# Patient Record
Sex: Male | Born: 1970 | Race: White | Hispanic: No | Marital: Married | Smoking: Former smoker
Health system: Southern US, Community
[De-identification: ages and names within clinical notes are randomized; demographics above are authoritative.]

## PROBLEM LIST (undated history)

## (undated) DIAGNOSIS — Z973 Presence of spectacles and contact lenses: Secondary | ICD-10-CM

## (undated) DIAGNOSIS — N201 Calculus of ureter: Secondary | ICD-10-CM

## (undated) DIAGNOSIS — F1911 Other psychoactive substance abuse, in remission: Secondary | ICD-10-CM

## (undated) DIAGNOSIS — G43909 Migraine, unspecified, not intractable, without status migrainosus: Secondary | ICD-10-CM

## (undated) DIAGNOSIS — N200 Calculus of kidney: Secondary | ICD-10-CM

## (undated) DIAGNOSIS — Z87442 Personal history of urinary calculi: Secondary | ICD-10-CM

## (undated) HISTORY — PX: WISDOM TOOTH EXTRACTION: SHX21

---

## 1991-10-26 HISTORY — PX: OTHER SURGICAL HISTORY: SHX169

## 1993-10-25 HISTORY — PX: OTHER SURGICAL HISTORY: SHX169

## 1998-10-25 HISTORY — PX: OTHER SURGICAL HISTORY: SHX169

## 1999-07-15 ENCOUNTER — Ambulatory Visit (HOSPITAL_BASED_OUTPATIENT_CLINIC_OR_DEPARTMENT_OTHER): Admission: RE | Admit: 1999-07-15 | Discharge: 1999-07-15 | Payer: Self-pay | Admitting: Orthopedic Surgery

## 2009-10-25 HISTORY — PX: OTHER SURGICAL HISTORY: SHX169

## 2011-01-15 DIAGNOSIS — R079 Chest pain, unspecified: Secondary | ICD-10-CM

## 2011-04-11 ENCOUNTER — Observation Stay (HOSPITAL_COMMUNITY)
Admission: EM | Admit: 2011-04-11 | Discharge: 2011-04-12 | DRG: 464 | Disposition: A | Discharge status: Home / Self Care | Payer: BC Managed Care – PPO | Source: Ambulatory Visit | Attending: General Surgery | Admitting: General Surgery

## 2011-04-11 ENCOUNTER — Emergency Department (HOSPITAL_COMMUNITY): Payer: BC Managed Care – PPO

## 2011-04-11 ENCOUNTER — Encounter (HOSPITAL_COMMUNITY): Payer: Self-pay | Admitting: Radiology

## 2011-04-11 DIAGNOSIS — S139XXA Sprain of joints and ligaments of unspecified parts of neck, initial encounter: Principal | ICD-10-CM | POA: Insufficient documentation

## 2011-04-11 DIAGNOSIS — T07XXXA Unspecified multiple injuries, initial encounter: Secondary | ICD-10-CM | POA: Insufficient documentation

## 2011-04-11 DIAGNOSIS — R209 Unspecified disturbances of skin sensation: Secondary | ICD-10-CM | POA: Insufficient documentation

## 2011-04-11 DIAGNOSIS — R4789 Other speech disturbances: Secondary | ICD-10-CM | POA: Insufficient documentation

## 2011-04-11 DIAGNOSIS — IMO0002 Reserved for concepts with insufficient information to code with codable children: Secondary | ICD-10-CM | POA: Insufficient documentation

## 2011-04-11 DIAGNOSIS — Y998 Other external cause status: Secondary | ICD-10-CM | POA: Insufficient documentation

## 2011-04-11 DIAGNOSIS — R4182 Altered mental status, unspecified: Secondary | ICD-10-CM | POA: Insufficient documentation

## 2011-04-11 LAB — CBC
HCT: 44.5 % (ref 39.0–52.0)
MCV: 86.6 fL (ref 78.0–100.0)
RDW: 13.9 % (ref 11.5–15.5)
WBC: 9.9 10*3/uL (ref 4.0–10.5)

## 2011-04-11 LAB — COMPREHENSIVE METABOLIC PANEL
ALT: 40 U/L (ref 0–53)
AST: 28 U/L (ref 0–37)
Alkaline Phosphatase: 77 U/L (ref 39–117)
CO2: 23 mEq/L (ref 19–32)
Chloride: 103 mEq/L (ref 96–112)
Creatinine, Ser: 0.84 mg/dL (ref 0.50–1.35)
GFR calc non Af Amer: >60 mL/min (ref >60)
Sodium: 139 mEq/L (ref 135–145)
Total Bilirubin: 0.4 mg/dL (ref 0.3–1.2)

## 2011-04-11 LAB — POCT I-STAT, CHEM 8
HCT: 49 % (ref 39.0–52.0)
Hemoglobin: 16.7 g/dL (ref 13.0–17.0)
Potassium: 3.9 mEq/L (ref 3.5–5.1)
Sodium: 142 mEq/L (ref 135–145)

## 2011-04-11 LAB — TYPE AND SCREEN
ABO/RH(D): O POS
Antibody Screen: NEGATIVE
Unit division: 0

## 2011-04-11 LAB — PROTIME-INR: INR: 1.06 (ref 0.00–1.49)

## 2011-04-11 MED ORDER — IOHEXOL 300 MG/ML  SOLN
100.0000 mL | Freq: Once | INTRAMUSCULAR | Status: AC | PRN
  Administered 2011-04-11: 100 mL via INTRAVENOUS

## 2011-04-12 ENCOUNTER — Inpatient Hospital Stay (HOSPITAL_COMMUNITY): Payer: BC Managed Care – PPO

## 2011-04-12 LAB — CBC
HCT: 41 % (ref 39.0–52.0)
MCH: 30.5 pg (ref 26.0–34.0)
MCHC: 34.4 g/dL (ref 30.0–36.0)
MCV: 88.7 fL (ref 78.0–100.0)
RDW: 14.4 % (ref 11.5–15.5)

## 2011-04-12 LAB — DRUGS OF ABUSE SCREEN W/O ALC, ROUTINE URINE
Amphetamine Screen, Ur: NEGATIVE
Barbiturate Quant, Ur: NEGATIVE
Benzodiazepines.: NEGATIVE
Creatinine,U: 221 mg/dL
Marijuana Metabolite: NEGATIVE
Phencyclidine (PCP): NEGATIVE

## 2011-04-12 LAB — URINALYSIS, MICROSCOPIC ONLY
Bilirubin Urine: NEGATIVE
Hgb urine dipstick: NEGATIVE
Protein, ur: NEGATIVE mg/dL
Urobilinogen, UA: 0.2 mg/dL (ref 0.0–1.0)

## 2011-04-12 LAB — BASIC METABOLIC PANEL
CO2: 26 mEq/L (ref 19–32)
GFR calc non Af Amer: >60 mL/min (ref >60)
Glucose, Bld: 120 mg/dL — ABNORMAL HIGH (ref 70–99)
Potassium: 4.1 mEq/L (ref 3.5–5.1)
Sodium: 140 mEq/L (ref 135–145)
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
## 2013-07-27 ENCOUNTER — Encounter: Payer: Self-pay | Admitting: Internal Medicine

## 2013-07-27 ENCOUNTER — Ambulatory Visit (INDEPENDENT_AMBULATORY_CARE_PROVIDER_SITE_OTHER): Payer: BC Managed Care – PPO | Admitting: Internal Medicine

## 2013-07-27 ENCOUNTER — Ambulatory Visit (INDEPENDENT_AMBULATORY_CARE_PROVIDER_SITE_OTHER): Payer: BC Managed Care – PPO

## 2013-07-27 VITALS — BP 114/80 | HR 72 | Temp 97.8°F | Ht 70.0 in | Wt 193.8 lb

## 2013-07-27 DIAGNOSIS — Z130 Encounter for screening for diseases of the blood and blood-forming organs and certain disorders involving the immune mechanism: Secondary | ICD-10-CM

## 2013-07-27 DIAGNOSIS — Z13220 Encounter for screening for lipoid disorders: Secondary | ICD-10-CM

## 2013-07-27 DIAGNOSIS — F329 Major depressive disorder, single episode, unspecified: Secondary | ICD-10-CM

## 2013-07-27 DIAGNOSIS — R519 Headache, unspecified: Secondary | ICD-10-CM

## 2013-07-27 DIAGNOSIS — Z0000 Encounter for general adult medical examination without abnormal findings: Secondary | ICD-10-CM

## 2013-07-27 DIAGNOSIS — F32A Depression, unspecified: Secondary | ICD-10-CM

## 2013-07-27 DIAGNOSIS — F429 Obsessive-compulsive disorder, unspecified: Secondary | ICD-10-CM

## 2013-07-27 DIAGNOSIS — R51 Headache: Secondary | ICD-10-CM

## 2013-07-27 DIAGNOSIS — F341 Dysthymic disorder: Secondary | ICD-10-CM

## 2013-07-27 LAB — CBC
HCT: 44.4 % (ref 39.0–52.0)
Hemoglobin: 15.3 g/dL (ref 13.0–17.0)
RBC: 4.97 Mil/uL (ref 4.22–5.81)
WBC: 8.3 10*3/uL (ref 4.5–10.5)

## 2013-07-27 LAB — COMPREHENSIVE METABOLIC PANEL
BUN: 10 mg/dL (ref 6–23)
CO2: 26 mEq/L (ref 19–32)
Creatinine, Ser: 0.9 mg/dL (ref 0.4–1.5)
GFR: 93.93 mL/min (ref >60.00)
Glucose, Bld: 82 mg/dL (ref 70–99)
Total Bilirubin: 0.8 mg/dL (ref 0.3–1.2)

## 2013-07-27 LAB — LIPID PANEL
Cholesterol: 216 mg/dL — ABNORMAL HIGH (ref 0–200)
HDL: 45.6 mg/dL (ref >39.00)
VLDL: 40.4 mg/dL — ABNORMAL HIGH (ref 0.0–40.0)

## 2013-07-27 MED ORDER — CITALOPRAM HYDROBROMIDE 20 MG PO TABS: 20.0000 mg | ORAL_TABLET | Freq: Every day | ORAL | Status: DC

## 2013-07-27 MED ORDER — SUMATRIPTAN SUCCINATE 100 MG PO TABS: 100.0000 mg | ORAL_TABLET | ORAL | Status: DC | PRN

## 2013-07-27 NOTE — Patient Instructions (Signed)
Health Maintenance, Males A healthy lifestyle and preventative care can promote health and wellness.  Maintain regular health, dental, and eye exams.  Eat a healthy diet. Foods like vegetables, fruits, whole grains, low-fat dairy products, and lean protein foods contain the nutrients you need without too many calories. Decrease your intake of foods high in solid fats, added sugars, and salt. Get information about a proper diet from your caregiver, if necessary.  Regular physical exercise is one of the most important things you can do for your health. Most adults should get at least 150 minutes of moderate-intensity exercise (any activity that increases your heart rate and causes you to sweat) each week. In addition, most adults need muscle-strengthening exercises on 2 or more days a week.   Maintain a healthy weight. The body mass index (BMI) is a screening tool to identify possible weight problems. It provides an estimate of body fat based on height and weight. Your caregiver can help determine your BMI, and can help you achieve or maintain a healthy weight. For adults 20 years and older:  A BMI below 18.5 is considered underweight.  A BMI of 18.5 to 24.9 is normal.  A BMI of 25 to 29.9 is considered overweight.  A BMI of 30 and above is considered obese.  Maintain normal blood lipids and cholesterol by exercising and minimizing your intake of saturated fat. Eat a balanced diet with plenty of fruits and vegetables. Blood tests for lipids and cholesterol should begin at age 20 and be repeated every 5 years. If your lipid or cholesterol levels are high, you are over 50, or you are a high risk for heart disease, you may need your cholesterol levels checked more frequently.Ongoing high lipid and cholesterol levels should be treated with medicines, if diet and exercise are not effective.  If you smoke, find out from your caregiver how to quit. If you do not use tobacco, do not start.  If you  choose to drink alcohol, do not exceed 2 drinks per day. One drink is considered to be 12 ounces (355 mL) of beer, 5 ounces (148 mL) of wine, or 1.5 ounces (44 mL) of liquor.  Avoid use of street drugs. Do not share needles with anyone. Ask for help if you need support or instructions about stopping the use of drugs.  High blood pressure causes heart disease and increases the risk of stroke. Blood pressure should be checked at least every 1 to 2 years. Ongoing high blood pressure should be treated with medicines if weight loss and exercise are not effective.  If you are 45 to 43 years old, ask your caregiver if you should take aspirin to prevent heart disease.  Diabetes screening involves taking a blood sample to check your fasting blood sugar level. This should be done once every 3 years, after age 45, if you are within normal weight and without risk factors for diabetes. Testing should be considered at a younger age or be carried out more frequently if you are overweight and have at least 1 risk factor for diabetes.  Colorectal cancer can be detected and often prevented. Most routine colorectal cancer screening begins at the age of 50 and continues through age 75. However, your caregiver may recommend screening at an earlier age if you have risk factors for colon cancer. On a yearly basis, your caregiver may provide home test kits to check for hidden blood in the stool. Use of a small camera at the end of a tube,   to directly examine the colon (sigmoidoscopy or colonoscopy), can detect the earliest forms of colorectal cancer. Talk to your caregiver about this at age 50, when routine screening begins. Direct examination of the colon should be repeated every 5 to 10 years through age 75, unless early forms of pre-cancerous polyps or small growths are found.  Hepatitis C blood testing is recommended for all people born from 1945 through 1965 and any individual with known risks for hepatitis C.  Healthy  men should no longer receive prostate-specific antigen (PSA) blood tests as part of routine cancer screening. Consult with your caregiver about prostate cancer screening.  Testicular cancer screening is not recommended for adolescents or adult males who have no symptoms. Screening includes self-exam, caregiver exam, and other screening tests. Consult with your caregiver about any symptoms you have or any concerns you have about testicular cancer.  Practice safe sex. Use condoms and avoid high-risk sexual practices to reduce the spread of sexually transmitted infections (STIs).  Use sunscreen with a sun protection factor (SPF) of 30 or greater. Apply sunscreen liberally and repeatedly throughout the day. You should seek shade when your shadow is shorter than you. Protect yourself by wearing long sleeves, pants, a wide-brimmed hat, and sunglasses year round, whenever you are outdoors.  Notify your caregiver of new moles or changes in moles, especially if there is a change in shape or color. Also notify your caregiver if a mole is larger than the size of a pencil eraser.  A one-time screening for abdominal aortic aneurysm (AAA) and surgical repair of large AAAs by sound wave imaging (ultrasonography) is recommended for ages 65 to 75 years who are current or former smokers.  Stay current with your immunizations. Document Released: 04/08/2008 Document Revised: 01/03/2012 Document Reviewed: 03/08/2011 Exit[employer] Patient Information 2014 [employer].  

## 2013-07-27 NOTE — Progress Notes (Signed)
HPI  Pt presents to the clinic today to establish care. He does have multiple concerns today. 1- He has headaches mostly every day. The headaches start behind his eyes. It does feel like the headaches "flushes" up his forehead. He describes them as sharp and stabbing. He does take imitrex. This does take the edge off. He is having more frequent headaches. He is very stressed and does report having OCD. He has never been treated for OCD. 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 2- He c/o losing his things around the house. This makes him think he has a blood clot in his brain. This is all he can think about. His grandma had a blood clot in her brain. He denies one sided weakness, confusion, difficulty with speech or thought process.  Flu: 2014 Tetanus: within last 10 years Eye Doctor: yearly Dentist: biannually     History reviewed. No pertinent past medical history.  Current Outpatient Prescriptions  Medication Sig Dispense Refill  . SUMAtriptan (IMITREX) 100 MG tablet Take 100 mg by mouth every 2 (two) hours as needed for migraine. May repeat in 2 hours if headache persists or recurs.       No current facility-administered medications for this visit.    No Known Allergies  History reviewed. No pertinent family history.  History   Social History  . Marital Status: Married    Spouse Name: N/A    Number of Children: N/A  . Years of Education: N/A   Occupational History  . Not on file.   Social History Main Topics  . Smoking status: Current Every Day Smoker    Types: Cigarettes  . Smokeless tobacco: Not on file  . Alcohol Use: No  . Drug Use: No  . Sexual Activity: Not on file   Other Topics Concern  . Not on file   Social History Narrative  . No narrative on file    ROS:  Constitutional: Pt reports headaches. Denies fever, malaise, fatigue, or abrupt weight changes.  HEENT:  Denies eye pain, eye redness, ear pain, ringing in the ears, wax buildup, runny nose, nasal congestion, bloody nose, or sore throat. Respiratory: Denies difficulty breathing, shortness of breath, cough or sputum production.   Cardiovascular: Denies chest pain, chest tightness, palpitations or swelling in the hands or feet.  Gastrointestinal: Denies abdominal pain, bloating, constipation, diarrhea or blood in the stool.  GU: Denies frequency, urgency, pain with urination, blood in urine, odor or discharge. Musculoskeletal: Denies decrease in range of motion, difficulty with gait, muscle pain or joint pain and swelling.  Skin: Denies redness, rashes, lesions or ulcercations.  Neurological: Denies dizziness, difficulty with memory, difficulty with speech or problems with balance and coordination.   No other specific complaints in a complete review of systems (except as listed in HPI above).  PE:  BP 114/80  Pulse 72  Temp(Src) 97.8 F (36.6 C) (Oral)  Ht 5\' 10"  (1.778 m)  Wt 193 lb 12 oz (87.884 kg)  BMI 27.8 kg/m2  SpO2 98% Wt Readings from Last 3 Encounters:  07/27/13 193 lb 12 oz (87.884 kg)    General: Appears his stated age, well developed, well nourished in NAD. HEENT: Head: normal shape and size; Eyes: sclera white, no icterus, conjunctiva pink, PERRLA and EOMs intact; Ears: Tm's gray and intact, normal light reflex; Nose: mucosa pink and moist, septum midline; Throat/Mouth: Teeth present, mucosa pink  and moist, no lesions or ulcerations noted.  Neck: Normal range of motion. Neck supple, trachea midline. No massses, lumps or thyromegaly present.  Cardiovascular: Normal rate and rhythm. S1,S2 noted.  No murmur, rubs or gallops noted. No JVD or BLE edema. No carotid bruits noted. Pulmonary/Chest: Normal effort and positive vesicular breath sounds. No respiratory distress. No wheezes, rales or ronchi noted.  Abdomen: Soft and nontender. Normal bowel sounds, no bruits noted. No  distention or masses noted. Liver, spleen and kidneys non palpable. Musculoskeletal: Normal range of motion. No signs of joint swelling. No difficulty with gait. Notable left AC joint abnormality. Neurological: Alert and oriented. Cranial nerves II-XII intact. Coordination normal. +DTRs bilaterally. Psychiatric: Mood somewhat obsessive and affect normal. Behavior is normal. Judgment and thought content normal.    BMET    Component Value Date/Time   NA 140 04/12/2011 0600   K 4.1 04/12/2011 0600   CL 107 04/12/2011 0600   CO2 26 04/12/2011 0600   GLUCOSE 120* 04/12/2011 0600   BUN 5* 04/12/2011 0600   CREATININE 0.63 04/12/2011 0600   CALCIUM 8.5 04/12/2011 0600   GFRNONAA >60 04/12/2011 0600   GFRAA >60 04/12/2011 0600    Lipid Panel  No results found for this basename: chol, trig, hdl, cholhdl, vldl, ldlcalc    CBC    Component Value Date/Time   WBC 8.8 04/12/2011 0600   RBC 4.62 04/12/2011 0600   HGB 14.1 04/12/2011 0600   HCT 41.0 04/12/2011 0600   PLT 218 04/12/2011 0600   MCV 88.7 04/12/2011 0600   MCH 30.5 04/12/2011 0600   MCHC 34.4 04/12/2011 0600   RDW 14.4 04/12/2011 0600    Hgb A1C No results found for this basename: HGBA1C     Assessment and Plan:  Prevent Health Maintenance:  All HM UTD Pt declines flu shot today Will obtain screening labs today  RTC in 6 months or sooner if needed

## 2013-07-27 NOTE — Assessment & Plan Note (Signed)
Offered pt referral to psychology for evaluation and diagnosis-pt decline Will treat with celexa for now

## 2013-07-27 NOTE — Assessment & Plan Note (Signed)
I think this is the cause of his headaches Lets start celexa 20 mg daily No benzodiazepines d/t hx of drug abuse I think he would benefit from CBT- pt declines at this time

## 2013-07-27 NOTE — Assessment & Plan Note (Signed)
?   Stress releated Lets treat stress and anxiety with celexa If no better in 4 weeks- trial verapamil  Has had MRI of brain which was normal

## 2013-08-02 ENCOUNTER — Telehealth: Payer: Self-pay | Admitting: Internal Medicine

## 2013-08-02 NOTE — Telephone Encounter (Signed)
Recd records from [facility], Forwarding 37pgs to [employer]

## 2013-08-21 ENCOUNTER — Encounter: Payer: Self-pay | Admitting: Internal Medicine

## 2013-09-04 ENCOUNTER — Telehealth: Payer: Self-pay | Admitting: Internal Medicine

## 2013-09-04 NOTE — Telephone Encounter (Signed)
Patient's wife said you asked her to call you in a month about patient.  Please call Christopher Colon.

## 2013-09-04 NOTE — Telephone Encounter (Signed)
I wanted to know if his headaches had gotten better since we started him on the celexa. If they have not, I was going to put him on verapamil.

## 2013-09-04 NOTE — Telephone Encounter (Signed)
I spoke with patient's wife and she said his headaches were better until last Thursday.  He had a bad headache last Thursday and had to come home from work on Friday.  He stayed in bed on Friday and Saturday.  She said by Monday the headache was gone.  She said he has been sweating since he has been taking the medication.  It has affected him sexually.  She said he has trouble ejaculating.  I asked her if he wanted to change medications and she said she would let you decide if he needs to change his medication.  His pharmacy is [facility] and she said you can call her on her cell phone.

## 2013-10-01 ENCOUNTER — Other Ambulatory Visit: Payer: Self-pay | Admitting: *Deleted

## 2013-10-01 DIAGNOSIS — F329 Major depressive disorder, single episode, unspecified: Secondary | ICD-10-CM

## 2013-10-01 MED ORDER — CITALOPRAM HYDROBROMIDE 20 MG PO TABS: 20.0000 mg | ORAL_TABLET | Freq: Every day | ORAL | Status: DC

## 2013-10-08 ENCOUNTER — Other Ambulatory Visit: Payer: Self-pay | Admitting: Internal Medicine

## 2013-11-05 ENCOUNTER — Other Ambulatory Visit: Payer: Self-pay

## 2013-11-05 ENCOUNTER — Other Ambulatory Visit: Payer: Self-pay | Admitting: Internal Medicine

## 2013-11-05 NOTE — Telephone Encounter (Signed)
Left message on voicemail for pt to return call  

## 2013-11-05 NOTE — Telephone Encounter (Signed)
Ok then I am refusing the refill request because if this is a 3 month supply, he should be getting it filled every 3 months, not monthly, Last refill was 10/08/13

## 2013-11-05 NOTE — Telephone Encounter (Signed)
Can you call pt and verify how often he is taking his flexeril?

## 2013-11-05 NOTE — Telephone Encounter (Signed)
Pt does take it BID but sometimes he does not need to and only does QD--but his insurance only covers 90 day supply

## 2013-11-05 NOTE — Telephone Encounter (Signed)
Last filled 10/08/13--please advise 

## 2013-11-07 ENCOUNTER — Telehealth: Payer: Self-pay

## 2013-11-07 NOTE — Telephone Encounter (Signed)
She needs to be seen.

## 2013-11-07 NOTE — Telephone Encounter (Signed)
Mrs Yanes said pt came home from work and went straight to bed with aching all over, non prod cough, has not taken temp but pt goes between feeling hot and cold. Pt began to feel symptoms on 11/06/12 but is much worse today and 3 coworkers have tested positive for flu. Pt not having SOB, wheezing or CP. Mrs Treese said pt does not feel like getting out of bed to be seen; request advise and med sent to [facility]. Mrs Sussman request cb.

## 2013-11-08 NOTE — Telephone Encounter (Signed)
Was unable to leave VM on cell phone because it has not been set up but let one on home number asking pt to return call

## 2013-11-08 NOTE — Telephone Encounter (Signed)
Spoke to patient's wife and she is aware that he needs to be seen but she states he will not come in and if he gets worse maybe he will decide to come in

## 2013-11-17 ENCOUNTER — Other Ambulatory Visit: Payer: Self-pay | Admitting: Internal Medicine

## 2013-11-19 NOTE — Telephone Encounter (Signed)
Christopher Colon pts wife left v/m; Christopher Colon thought cyclobenzaprine 5 mg was called in on 10/08/13 and she did not pick up rx because pt takes cyclobenzaprine 10 mg. Spoke with Christopher Colon, pharmacist at [facility] and he checked and rx was sent electronically on 10/08/13 for 10 mg not 5 mg. Christopher Colon will get ready and Christopher Colon working at that pharmacy and he will [employer].

## 2013-12-31 ENCOUNTER — Other Ambulatory Visit: Payer: Self-pay | Admitting: Internal Medicine

## 2014-02-15 ENCOUNTER — Other Ambulatory Visit: Payer: Self-pay | Admitting: Internal Medicine

## 2014-02-18 NOTE — Telephone Encounter (Signed)
Last OV was 10/14--Rx last refilled 12/14 90 day supply--please advise

## 2014-03-28 ENCOUNTER — Telehealth: Payer: Self-pay | Admitting: Internal Medicine

## 2014-03-28 NOTE — Telephone Encounter (Signed)
Patient Information:  Caller Name: Butch Penny  Phone: [phone]  Patient: Kenan, Moodie  Gender: Male  DOB: Jun 24, 1971  Age: 43 Years  PCP: Webb Silversmith  Office Follow Up:  Does the office need to follow up with this patient?: No  Instructions For The Office: N/A  RN Note:  Pt agrees to OV vs ED today  Symptoms  Reason For Call & Symptoms: Pt has ingrown toe nail.  It has been an ongoing problem.  Wife states that it looks really bad.  It red and inflammed.  Drainage is also present.  The toe is red.  Small part of the toe nail is black.  Pain is severe  Reviewed Health History In EMR: Yes  Reviewed Medications In EMR: Yes  Reviewed Allergies In EMR: Yes  Reviewed Surgeries / Procedures: Yes  Date of Onset of Symptoms: 03/21/2014  Guideline(s) Used:  Toe Pain  Disposition Per Guideline:   Go to Office Now  Reason For Disposition Reached:   Severe pain (e.g., excruciating, unable to do any normal activities) and not improved after 2 hours of pain medicine  Advice Given:  N/A  Patient Will Follow Care Advice:  YES

## 2014-04-18 ENCOUNTER — Emergency Department (HOSPITAL_COMMUNITY): Payer: PRIVATE HEALTH INSURANCE

## 2014-04-18 ENCOUNTER — Encounter (HOSPITAL_COMMUNITY): Payer: Self-pay | Admitting: Emergency Medicine

## 2014-04-18 ENCOUNTER — Emergency Department (HOSPITAL_COMMUNITY)
Admission: EM | Admit: 2014-04-18 | Discharge: 2014-04-18 | Disposition: A | Discharge status: Home / Self Care | Payer: PRIVATE HEALTH INSURANCE | Attending: Emergency Medicine | Admitting: Emergency Medicine

## 2014-04-18 DIAGNOSIS — N200 Calculus of kidney: Secondary | ICD-10-CM

## 2014-04-18 DIAGNOSIS — Z8619 Personal history of other infectious and parasitic diseases: Secondary | ICD-10-CM | POA: Insufficient documentation

## 2014-04-18 DIAGNOSIS — G43911 Migraine, unspecified, intractable, with status migrainosus: Secondary | ICD-10-CM | POA: Insufficient documentation

## 2014-04-18 DIAGNOSIS — Z79899 Other long term (current) drug therapy: Secondary | ICD-10-CM | POA: Insufficient documentation

## 2014-04-18 DIAGNOSIS — Z87891 Personal history of nicotine dependence: Secondary | ICD-10-CM | POA: Insufficient documentation

## 2014-04-18 LAB — CBC WITH DIFFERENTIAL/PLATELET
BASOS PCT: 1 % (ref 0–1)
Basophils Absolute: 0 10*3/uL (ref 0.0–0.1)
Eosinophils Absolute: 0.1 10*3/uL (ref 0.0–0.7)
Eosinophils Relative: 1 % (ref 0–5)
HCT: 39.7 % (ref 39.0–52.0)
Hemoglobin: 13.6 g/dL (ref 13.0–17.0)
LYMPHS ABS: 2.6 10*3/uL (ref 0.7–4.0)
Lymphocytes Relative: 48 % — ABNORMAL HIGH (ref 12–46)
MCH: 30 pg (ref 26.0–34.0)
MCHC: 34.3 g/dL (ref 30.0–36.0)
MCV: 87.6 fL (ref 78.0–100.0)
MONO ABS: 0.6 10*3/uL (ref 0.1–1.0)
MONOS PCT: 11 % (ref 3–12)
NEUTROS ABS: 2.1 10*3/uL (ref 1.7–7.7)
Neutrophils Relative %: 39 % — ABNORMAL LOW (ref 43–77)
Platelets: 188 10*3/uL (ref 150–400)
RBC: 4.53 MIL/uL (ref 4.22–5.81)
RDW: 13.4 % (ref 11.5–15.5)
WBC: 5.4 10*3/uL (ref 4.0–10.5)

## 2014-04-18 LAB — COMPREHENSIVE METABOLIC PANEL
ALBUMIN: 4.1 g/dL (ref 3.5–5.2)
ALK PHOS: 55 U/L (ref 39–117)
ALT: 70 U/L — ABNORMAL HIGH (ref 0–53)
AST: 40 U/L — ABNORMAL HIGH (ref 0–37)
BILIRUBIN TOTAL: 0.4 mg/dL (ref 0.3–1.2)
BUN: 10 mg/dL (ref 6–23)
CHLORIDE: 102 meq/L (ref 96–112)
CO2: 26 mEq/L (ref 19–32)
CREATININE: 0.93 mg/dL (ref 0.50–1.35)
Calcium: 9.5 mg/dL (ref 8.4–10.5)
GLUCOSE: 77 mg/dL (ref 70–99)
Potassium: 4 mEq/L (ref 3.7–5.3)
Sodium: 141 mEq/L (ref 137–147)
Total Protein: 7.1 g/dL (ref 6.0–8.3)

## 2014-04-18 LAB — URINALYSIS, ROUTINE W REFLEX MICROSCOPIC
Bilirubin Urine: NEGATIVE
Glucose, UA: NEGATIVE mg/dL
Ketones, ur: NEGATIVE mg/dL
Leukocytes, UA: NEGATIVE
NITRITE: NEGATIVE
PROTEIN: NEGATIVE mg/dL
SPECIFIC GRAVITY, URINE: 1.009 (ref 1.005–1.030)
UROBILINOGEN UA: 0.2 mg/dL (ref 0.0–1.0)
pH: 6.5 (ref 5.0–8.0)

## 2014-04-18 LAB — URINE MICROSCOPIC-ADD ON

## 2014-04-18 MED ORDER — TAMSULOSIN HCL 0.4 MG PO CAPS: 0.4000 mg | ORAL_CAPSULE | Freq: Every day | ORAL | Status: DC

## 2014-04-18 MED ORDER — ONDANSETRON HCL 4 MG/2ML IJ SOLN
4.0000 mg | Freq: Once | INTRAMUSCULAR | Status: AC
  Administered 2014-04-18: 4 mg via INTRAVENOUS
  Filled 2014-04-18: qty 2

## 2014-04-18 MED ORDER — HYDROMORPHONE HCL PF 1 MG/ML IJ SOLN
1.0000 mg | Freq: Once | INTRAMUSCULAR | Status: AC
  Administered 2014-04-18: 1 mg via INTRAVENOUS
  Filled 2014-04-18: qty 1

## 2014-04-18 MED ORDER — OXYCODONE-ACETAMINOPHEN 5-325 MG PO TABS: 1.0000 | ORAL_TABLET | ORAL | Status: DC | PRN

## 2014-04-18 NOTE — Discharge Instructions (Signed)
Kidney Stones °Kidney stones (urolithiasis) are solid masses that form inside your kidneys. The intense pain is caused by the stone moving through the kidney, ureter, bladder, and urethra (urinary tract). When the stone moves, the ureter starts to spasm around the stone. The stone is usually passed in your pee (urine).  °HOME CARE °· Drink enough fluids to keep your pee clear or pale yellow. This helps to get the stone out. °· Strain all pee through the provided strainer. Do not pee without peeing through the strainer, not even once. If you pee the stone out, catch it in the strainer. The stone may be as small as a grain of salt. Take this to your doctor. This will help your doctor figure out what you can do to try to prevent more kidney stones. °· Only take medicine as told by your doctor. °· Follow up with your doctor as told. °· Get follow-up X-rays as told by your doctor. °GET HELP IF: °You have pain that gets worse even if you have been taking pain medicine. °GET HELP RIGHT AWAY IF:  °· Your pain does not get better with medicine. °· You have a fever or shaking chills. °· Your pain increases and gets worse over 18 hours. °· You have new belly (abdominal) pain. °· You feel faint or pass out. °· You are unable to pee. °MAKE SURE YOU:  °· Understand these instructions. °· Will watch your condition. °· Will get help right away if you are not doing well or get worse. °Document Released: 03/29/2008 Document Revised: 06/13/2013 Document Reviewed: 03/14/2013 °ExitCare® Patient Information ©2015 [employer]. This information is not intended to replace advice given to you by your health care provider. Make sure you discuss any questions you have with your health care provider. ° °Dietary Guidelines to Help Prevent Kidney Stones °Your risk of kidney stones can be decreased by adjusting the foods you eat. The most important thing you can do is drink enough fluid. You should drink enough fluid to keep your urine clear or  pale yellow. The following guidelines provide specific information for the type of kidney stone you have had. °GUIDELINES ACCORDING TO TYPE OF KIDNEY STONE °Calcium Oxalate Kidney Stones °· Reduce the amount of salt you eat. Foods that have a lot of salt cause your body to release excess calcium into your urine. The excess calcium can combine with a substance called oxalate to form kidney stones. °· Reduce the amount of animal protein you eat if the amount you eat is excessive. Animal protein causes your body to release excess calcium into your urine. Ask your dietitian how much protein from animal sources you should be eating. °· Avoid foods that are high in oxalates. If you take vitamins, they should have less than 500 mg of vitamin C. Your body turns vitamin C into oxalates. You do not need to avoid fruits and vegetables high in vitamin C. °Calcium Phosphate Kidney Stones °· Reduce the amount of salt you eat to help prevent the release of excess calcium into your urine. °· Reduce the amount of animal protein you eat if the amount you eat is excessive. Animal protein causes your body to release excess calcium into your urine. Ask your dietitian how much protein from animal sources you should be eating. °· Get enough calcium from food or take a calcium supplement (ask your dietitian for recommendations). Food sources of calcium that do not increase your risk of kidney stones include: °¨ Broccoli. °¨ Dairy products,   such as cheese and yogurt. °¨ Pudding. °Uric Acid Kidney Stones °· Do not have more than 6 oz of animal protein per day. °FOOD SOURCES °Animal Protein Sources °· Meat (all types). °· Poultry. °· Eggs. °· Fish, seafood. °Foods High in Salt °· Salt seasonings. °· Soy sauce. °· Teriyaki sauce. °· Cured and processed meats. °· Salted crackers and snack foods. °· Fast food. °· Canned soups and most canned foods. °Foods High in Oxalates °· Grains: °¨ Amaranth. °¨ Barley. °¨ Grits. °¨ Wheat  germ. °¨ Bran. °¨ Buckwheat flour. °¨ All bran cereals. °¨ Pretzels. °¨ Whole wheat bread. °· Vegetables: °¨ Beans (wax). °¨ Beets and beet greens. °¨ Collard greens. °¨ Eggplant. °¨ Escarole. °¨ Leeks. °¨ Okra. °¨ Parsley. °¨ Rutabagas. °¨ Spinach. °¨ Swiss chard. °¨ Tomato paste. °¨ Fried potatoes. °¨ Sweet potatoes. °· Fruits: °¨ Red currants. °¨ Figs. °¨ Kiwi. °¨ Rhubarb. °· Meat and Other Protein Sources: °¨ Beans (dried). °¨ Soy burgers and other soybean products. °¨ Miso. °¨ Nuts (peanuts, almonds, pecans, cashews, hazelnuts). °¨ Nut butters. °¨ Sesame seeds and tahini (paste made of sesame seeds). °¨ Poppy seeds. °· Beverages: °¨ Chocolate drink mixes. °¨ Soy milk. °¨ Instant iced tea. °¨ Juices made from high-oxalate fruits or vegetables. °· Other: °¨ Carob. °¨ Chocolate. °¨ Fruitcake. °¨ Marmalades. °Document Released: 02/05/2011 Document Revised: 10/16/2013 Document Reviewed: 09/07/2013 °ExitCare® Patient Information ©2015 [employer]. This information is not intended to replace advice given to you by your health care provider. Make sure you discuss any questions you have with your health care provider. ° °

## 2014-04-18 NOTE — ED Provider Notes (Signed)
CSN: [id number]     Arrival date & time 04/18/14  1720 History   First MD Initiated Contact with Patient 04/18/14 1919     Chief Complaint  Patient presents with  . Nephrolithiasis     (Consider location/radiation/quality/duration/timing/severity/associated sxs/prior Treatment) HPI Comments: Patient has a history of kidney stones and states approximately 2 weeks ago he felt like he was getting a stone in the left flank area but for the last 2 weeks he's had intermittent pain and hematuria. Having worsening pain in the left lower quadrant now in symptoms are not resolving so he came here for further evaluation. He initially was taking Aleve which gave him some pain control but today he is taken to hydrocodone and Aleve without improvement. He is always passed kidney stones on his own but they usually pass within 3-5 days.  Patient is a 43 y.o. male presenting with flank pain. The history is provided by the patient.  Flank Pain This is a recurrent problem. Episode onset: 2 weeks. Episode frequency: intermittent  The problem has been gradually worsening. Associated symptoms include abdominal pain. Nothing aggravates the symptoms. Nothing relieves the symptoms. He has tried nothing for the symptoms.    Past Medical History  Diagnosis Date  . Abuse, drug or alcohol   . Frequent headaches   . Chicken pox   . Kidney stones   . Migraines    History reviewed. No pertinent past surgical history. No family history on file. History  Substance Use Topics  . Smoking status: Former Smoker    Types: Cigarettes    Quit date: 01/16/2014  . Smokeless tobacco: Not on file  . Alcohol Use: No    Review of Systems  Gastrointestinal: Positive for abdominal pain.  Genitourinary: Positive for flank pain.  All other systems reviewed and are negative.     Allergies  Review of patient's allergies indicates no known allergies.  Home Medications   Prior to Admission medications   Medication Sig  Start Date End Date Taking? Authorizing Jhovany Weidinger  cyclobenzaprine (FLEXERIL) 10 MG tablet Take 10 mg by mouth 2 (two) times daily as needed for muscle spasms.   Yes Historical Teighan Aubert, MD  naproxen sodium (ANAPROX) 220 MG tablet Take 440 mg by mouth every 6 (six) hours as needed (for pain).   Yes Historical Tamani Durney, MD  SUMAtriptan (IMITREX) 100 MG tablet Take 1 tablet (100 mg total) by mouth every 2 (two) hours as needed for migraine. May repeat in 2 hours if headache persists or recurs. 07/27/13  Yes Webb Silversmith, NP   BP 119/89  Pulse 84  Temp(Src) 97.7 F (36.5 C) (Oral)  Resp 16  Ht 5\' 9"  (1.753 m)  Wt 197 lb (89.359 kg)  BMI 29.08 kg/m2  SpO2 99% Physical Exam  Nursing note and vitals reviewed. Constitutional: He is oriented to person, place, and time. He appears well-developed and well-nourished. No distress.  HENT:  Head: Normocephalic and atraumatic.  Mouth/Throat: Oropharynx is clear and moist.  Eyes: Conjunctivae and EOM are normal. Pupils are equal, round, and reactive to light.  Neck: Normal range of motion. Neck supple.  Cardiovascular: Normal rate, regular rhythm and intact distal pulses.   No murmur heard. Pulmonary/Chest: Effort normal and breath sounds normal. No respiratory distress. He has no wheezes. He has no rales.  Abdominal: Soft. He exhibits no distension. There is tenderness in the left lower quadrant. There is no rebound, no guarding and no CVA tenderness.  Musculoskeletal: Normal range of motion. He  exhibits no edema and no tenderness.  Neurological: He is alert and oriented to person, place, and time.  Skin: Skin is warm and dry. No rash noted. No erythema.  Psychiatric: He has a normal mood and affect. His behavior is normal.    ED Course  Procedures (including critical care time) Labs Review Labs Reviewed  URINALYSIS, ROUTINE W REFLEX MICROSCOPIC - Abnormal; Notable for the following:    Hgb urine dipstick LARGE (*)    All other components within  normal limits  CBC WITH DIFFERENTIAL - Abnormal; Notable for the following:    Neutrophils Relative % 39 (*)    Lymphocytes Relative 48 (*)    All other components within normal limits  COMPREHENSIVE METABOLIC PANEL - Abnormal; Notable for the following:    AST 40 (*)    ALT 70 (*)    All other components within normal limits  URINE MICROSCOPIC-ADD ON    Imaging Review Ct Abdomen Pelvis Wo Contrast  04/18/2014   CLINICAL DATA:  Left flank pain and hematuria.  EXAM: CT ABDOMEN AND PELVIS WITHOUT CONTRAST  TECHNIQUE: Multidetector CT imaging of the abdomen and pelvis was performed following the standard protocol without IV contrast.  COMPARISON:  04/11/2011  FINDINGS: The liver, spleen, pancreas, and adrenal glands appear unremarkable. No specific gallbladder or biliary abnormality identified.  Obstructive 5 mm left ureteral calculus just past the iliac cross over causing mild left hydronephrosis and hydroureter. There are also bilateral nonobstructive calculi including a 4 mm left kidney upper pole calculus.  1 cm porta hepatis lymph node, borderline prominent, significance uncertain.  Appendix normal. Scattered air-fluid levels are present in the upper normal size small bowel proximally.  Sigmoid diverticulosis.  Urinary bladder unremarkable.  IMPRESSION: 1. Obstructive 5 mm left ureteral calculus just past the iliac cross over causing mild left hydronephrosis and hydroureter. There are also bilateral nonobstructive calculi including a 4 mm left kidney upper pole calculus.  2. 1 cm porta hepatis lymph node, borderline prominent, significance uncertain.   Electronically Signed   By: Sherryl Barters M.D.   On: 04/18/2014 20:26     EKG Interpretation None      MDM   Final diagnoses:  None   Pt with symptoms consistent with kidney stone with prior hx of similar however states they have never taken this long to pass.  Lasting sx for 2 weeks.  Denies infectious sx, or GI symptoms.  Low concern  for diverticulitis and no risk factors or history suggestive of AAA.  No hx suggestive of GU source (discharge) and otherwise pt is healthy.  Will hydrate, treat pain and ensure no infection with UA, CBC, CMP and will get stone study due to persistent sx and complicated course to further eval.  7:55 PM All labs wnl except for blood in urine.  Pt given pain meds.  8:29 PM CT shows a 42mm stone with some hydronephrosis.  Will given pain control and have pt f/u with nephrology.     Blanchie Dessert, MD 04/18/14 2030

## 2014-04-18 NOTE — ED Notes (Signed)
Pt states he has had pain from the right side and "felt kidney stone travel down to his penis two week ago".  Urinating blood.  Pt c/o abdominal pressure all day.

## 2014-04-19 ENCOUNTER — Encounter (HOSPITAL_BASED_OUTPATIENT_CLINIC_OR_DEPARTMENT_OTHER): Payer: Self-pay | Admitting: *Deleted

## 2014-04-19 ENCOUNTER — Other Ambulatory Visit: Payer: Self-pay | Admitting: Urology

## 2014-04-19 NOTE — Progress Notes (Signed)
NPO AFTER MN. ARRIVE AT 0945. CURRENT LAB RESULTS IN CHART AND EPIC. MAY TAKE PRN MEDS IF NEEDED AM DOS W/ SIPS OF WATER.

## 2014-04-23 ENCOUNTER — Encounter (HOSPITAL_BASED_OUTPATIENT_CLINIC_OR_DEPARTMENT_OTHER): Payer: Self-pay

## 2014-04-23 ENCOUNTER — Ambulatory Visit (HOSPITAL_BASED_OUTPATIENT_CLINIC_OR_DEPARTMENT_OTHER): Payer: PRIVATE HEALTH INSURANCE | Admitting: Anesthesiology

## 2014-04-23 ENCOUNTER — Encounter (HOSPITAL_BASED_OUTPATIENT_CLINIC_OR_DEPARTMENT_OTHER)
Admission: RE | Disposition: A | Discharge status: Home / Self Care | Payer: Self-pay | Source: Ambulatory Visit | Attending: Urology

## 2014-04-23 ENCOUNTER — Ambulatory Visit (HOSPITAL_BASED_OUTPATIENT_CLINIC_OR_DEPARTMENT_OTHER)
Admission: RE | Admit: 2014-04-23 | Discharge: 2014-04-23 | Disposition: A | Discharge status: Home / Self Care | Payer: PRIVATE HEALTH INSURANCE | Source: Ambulatory Visit | Attending: Urology | Admitting: Urology

## 2014-04-23 ENCOUNTER — Encounter (HOSPITAL_BASED_OUTPATIENT_CLINIC_OR_DEPARTMENT_OTHER): Payer: PRIVATE HEALTH INSURANCE | Admitting: Anesthesiology

## 2014-04-23 DIAGNOSIS — N201 Calculus of ureter: Secondary | ICD-10-CM | POA: Insufficient documentation

## 2014-04-23 DIAGNOSIS — N1330 Unspecified hydronephrosis: Secondary | ICD-10-CM | POA: Insufficient documentation

## 2014-04-23 DIAGNOSIS — Z79899 Other long term (current) drug therapy: Secondary | ICD-10-CM | POA: Insufficient documentation

## 2014-04-23 DIAGNOSIS — F329 Major depressive disorder, single episode, unspecified: Secondary | ICD-10-CM | POA: Insufficient documentation

## 2014-04-23 DIAGNOSIS — F3289 Other specified depressive episodes: Secondary | ICD-10-CM | POA: Insufficient documentation

## 2014-04-23 DIAGNOSIS — F411 Generalized anxiety disorder: Secondary | ICD-10-CM | POA: Insufficient documentation

## 2014-04-23 DIAGNOSIS — Z87891 Personal history of nicotine dependence: Secondary | ICD-10-CM | POA: Insufficient documentation

## 2014-04-23 HISTORY — DX: Other psychoactive substance abuse, in remission: F19.11

## 2014-04-23 HISTORY — PX: HOLMIUM LASER APPLICATION: SHX5852

## 2014-04-23 HISTORY — DX: Personal history of urinary calculi: Z87.442

## 2014-04-23 HISTORY — DX: Calculus of ureter: N20.1

## 2014-04-23 HISTORY — PX: CYSTOSCOPY WITH RETROGRADE PYELOGRAM, URETEROSCOPY AND STENT PLACEMENT: SHX5789

## 2014-04-23 HISTORY — DX: Presence of spectacles and contact lenses: Z97.3

## 2014-04-23 HISTORY — DX: Migraine, unspecified, not intractable, without status migrainosus: G43.909

## 2014-04-23 HISTORY — DX: Calculus of kidney: N20.0

## 2014-04-23 SURGERY — CYSTOURETEROSCOPY, WITH RETROGRADE PYELOGRAM AND STENT INSERTION
Anesthesia: General | Site: Ureter | Laterality: Left

## 2014-04-23 MED ORDER — SODIUM CHLORIDE 0.9 % IR SOLN
Status: DC | PRN
  Administered 2014-04-23: 4000 mL

## 2014-04-23 MED ORDER — LACTATED RINGERS IV SOLN
INTRAVENOUS | Status: DC
  Administered 2014-04-23: 13:00:00 via INTRAVENOUS
  Filled 2014-04-23: qty 1000

## 2014-04-23 MED ORDER — FENTANYL CITRATE 0.05 MG/ML IJ SOLN
INTRAMUSCULAR | Status: DC | PRN
  Administered 2014-04-23 (×2): 50 ug via INTRAVENOUS

## 2014-04-23 MED ORDER — HYDROCODONE-ACETAMINOPHEN 5-325 MG PO TABS
ORAL_TABLET | ORAL | Status: AC
  Filled 2014-04-23: qty 1

## 2014-04-23 MED ORDER — MIDAZOLAM HCL 2 MG/2ML IJ SOLN
INTRAMUSCULAR | Status: AC
  Filled 2014-04-23: qty 2

## 2014-04-23 MED ORDER — CEFAZOLIN SODIUM 1-5 GM-% IV SOLN
1.0000 g | INTRAVENOUS | Status: DC
  Filled 2014-04-23: qty 50

## 2014-04-23 MED ORDER — CEFAZOLIN SODIUM-DEXTROSE 2-3 GM-% IV SOLR
2.0000 g | INTRAVENOUS | Status: AC
  Administered 2014-04-23: 2 g via INTRAVENOUS
  Filled 2014-04-23: qty 50

## 2014-04-23 MED ORDER — ACETAMINOPHEN 10 MG/ML IV SOLN
INTRAVENOUS | Status: DC | PRN
  Administered 2014-04-23: 1000 mg via INTRAVENOUS

## 2014-04-23 MED ORDER — FENTANYL CITRATE 0.05 MG/ML IJ SOLN
INTRAMUSCULAR | Status: AC
  Filled 2014-04-23: qty 4

## 2014-04-23 MED ORDER — FENTANYL CITRATE 0.05 MG/ML IJ SOLN
25.0000 ug | INTRAMUSCULAR | Status: DC | PRN
  Filled 2014-04-23: qty 1

## 2014-04-23 MED ORDER — IOHEXOL 350 MG/ML SOLN
INTRAVENOUS | Status: DC | PRN
  Administered 2014-04-23: 20 mL

## 2014-04-23 MED ORDER — DEXAMETHASONE SODIUM PHOSPHATE 4 MG/ML IJ SOLN
INTRAMUSCULAR | Status: DC | PRN
  Administered 2014-04-23: 10 mg via INTRAVENOUS

## 2014-04-23 MED ORDER — HYDROCODONE-ACETAMINOPHEN 5-325 MG PO TABS: 1.0000 | ORAL_TABLET | Freq: Four times a day (QID) | ORAL | Status: DC | PRN

## 2014-04-23 MED ORDER — STERILE WATER FOR IRRIGATION IR SOLN
Status: DC | PRN
  Administered 2014-04-23: 500 mL

## 2014-04-23 MED ORDER — LIDOCAINE HCL (CARDIAC) 20 MG/ML IV SOLN
INTRAVENOUS | Status: DC | PRN
Start: 2014-04-23 — End: 2014-04-23
  Administered 2014-04-23: 10 mg via INTRAVENOUS

## 2014-04-23 MED ORDER — KETOROLAC TROMETHAMINE 30 MG/ML IJ SOLN
INTRAMUSCULAR | Status: DC | PRN
  Administered 2014-04-23: 30 mg via INTRAVENOUS

## 2014-04-23 MED ORDER — HYDROCODONE-ACETAMINOPHEN 5-325 MG PO TABS
1.0000 | ORAL_TABLET | Freq: Four times a day (QID) | ORAL | Status: DC | PRN
  Administered 2014-04-23: 1 via ORAL
  Filled 2014-04-23: qty 1

## 2014-04-23 MED ORDER — PROPOFOL 10 MG/ML IV BOLUS
INTRAVENOUS | Status: DC | PRN
Start: 2014-04-23 — End: 2014-04-23
  Administered 2014-04-23: 300 mg via INTRAVENOUS

## 2014-04-23 MED ORDER — MIDAZOLAM HCL 5 MG/5ML IJ SOLN
INTRAMUSCULAR | Status: DC | PRN
  Administered 2014-04-23: 2 mg via INTRAVENOUS

## 2014-04-23 MED ORDER — LACTATED RINGERS IV SOLN
INTRAVENOUS | Status: DC
  Administered 2014-04-23: 10:00:00 via INTRAVENOUS
  Filled 2014-04-23: qty 1000

## 2014-04-23 SURGICAL SUPPLY — 43 items
ADAPTER CATH URET PLST 4-6FR (CATHETERS) IMPLANT
ADPR CATH URET STRL DISP 4-6FR (CATHETERS)
BAG DRAIN URO-CYSTO SKYTR STRL (DRAIN) ×3 IMPLANT
BAG DRN UROCATH (DRAIN) ×1
BASKET LASER NITINOL 1.9FR (BASKET) IMPLANT
BASKET STNLS GEMINI 4WIRE 3FR (BASKET) IMPLANT
BASKET ZERO TIP NITINOL 2.4FR (BASKET) ×2 IMPLANT
BSKT STON RTRVL 120 1.9FR (BASKET)
BSKT STON RTRVL GEM 120X11 3FR (BASKET)
BSKT STON RTRVL ZERO TP 2.4FR (BASKET) ×1
CANISTER SUCT LVC 12 LTR MEDI- (MISCELLANEOUS) ×2 IMPLANT
CATH INTERMIT  6FR 70CM (CATHETERS) IMPLANT
CATH URET 5FR 28IN CONE TIP (BALLOONS)
CATH URET 5FR 28IN OPEN ENDED (CATHETERS) IMPLANT
CATH URET 5FR 70CM CONE TIP (BALLOONS) IMPLANT
CLOTH BEACON ORANGE TIMEOUT ST (SAFETY) ×3 IMPLANT
DRAPE CAMERA CLOSED 9X96 (DRAPES) IMPLANT
FIBER LASER FLEXIVA 1000 (UROLOGICAL SUPPLIES) IMPLANT
FIBER LASER FLEXIVA 200 (UROLOGICAL SUPPLIES) ×2 IMPLANT
FIBER LASER FLEXIVA 365 (UROLOGICAL SUPPLIES) IMPLANT
FIBER LASER FLEXIVA 550 (UROLOGICAL SUPPLIES) IMPLANT
GLOVE BIO SURGEON STRL SZ7 (GLOVE) ×3 IMPLANT
GLOVE BIOGEL M 6.5 STRL (GLOVE) ×2 IMPLANT
GLOVE BIOGEL M STER SZ 6 (GLOVE) ×2 IMPLANT
GLOVE INDICATOR 6.5 STRL GRN (GLOVE) ×4 IMPLANT
GOWN STRL REUS W/TWL LRG LVL3 (GOWN DISPOSABLE) ×6 IMPLANT
GUIDEWIRE 0.038 PTFE COATED (WIRE) ×3 IMPLANT
GUIDEWIRE ANG ZIPWIRE 038X150 (WIRE) IMPLANT
GUIDEWIRE STR DUAL SENSOR (WIRE) IMPLANT
IV NS 1000ML (IV SOLUTION) ×3
IV NS 1000ML BAXH (IV SOLUTION) IMPLANT
IV NS IRRIG 3000ML ARTHROMATIC (IV SOLUTION) ×2 IMPLANT
KIT BALLIN UROMAX 15FX10 (LABEL) IMPLANT
KIT BALLN UROMAX 15FX4 (MISCELLANEOUS) IMPLANT
KIT BALLN UROMAX 26 75X4 (MISCELLANEOUS)
NS IRRIG 500ML POUR BTL (IV SOLUTION) IMPLANT
PACK CYSTOSCOPY (CUSTOM PROCEDURE TRAY) ×3 IMPLANT
SET HIGH PRES BAL DIL (LABEL)
SHEATH ACCESS URETERAL 38CM (SHEATH) ×2 IMPLANT
SHEATH URET ACCESS 12FR/35CM (UROLOGICAL SUPPLIES) IMPLANT
SHEATH URET ACCESS 12FR/55CM (UROLOGICAL SUPPLIES) IMPLANT
STENT ×2 IMPLANT
STENT URET 6FRX24 CONTOUR (STENTS) ×1 IMPLANT

## 2014-04-23 NOTE — Anesthesia Postprocedure Evaluation (Signed)
  Anesthesia Post-op Note  Patient: Christopher Colon  Procedure(s) Performed: Procedure(s) (LRB): CYSTOSCOPY WITH RETROGRADE PYELOGRAM, DIGITAL URETEROSCOPY REMOVAL LT STONE WITH BASKET AND STENT PLACEMENT (Left) HOLMIUM LASER APPLICATION (Left)  Patient Location: PACU  Anesthesia Type: General  Level of Consciousness: awake and alert   Airway and Oxygen Therapy: Patient Spontanous Breathing  Post-op Pain: mild  Post-op Assessment: Post-op Vital signs reviewed, Patient's Cardiovascular Status Stable, Respiratory Function Stable, Patent Airway and No signs of Nausea or vomiting  Last Vitals:  Filed Vitals:   04/23/14 1300  BP: 119/73  Pulse: 80  Temp:   Resp: 16    Post-op Vital Signs: stable   Complications: No apparent anesthesia complications

## 2014-04-23 NOTE — Discharge Instructions (Signed)
Post Anesthesia Home Care Instructions  Activity: Get plenty of rest for the remainder of the day. A responsible adult should stay with you for 24 hours following the procedure.  For the next 24 hours, DO NOT: -Drive a car -Paediatric nurse -Drink alcoholic beverages -Take any medication unless instructed by your physician -Make any legal decisions or sign important papers.  Meals: Start with liquid foods such as gelatin or soup. Progress to regular foods as tolerated. Avoid greasy, spicy, heavy foods. If nausea and/or vomiting occur, drink only clear liquids until the nausea and/or vomiting subsides. Call your physician if vomiting continues.  Special Instructions/Symptoms: Your throat may feel dry or sore from the anesthesia or the breathing tube placed in your throat during surgery. If this causes discomfort, gargle with warm salt water. The discomfort should disappear within 24 hours. CYSTOSCOPY HOME CARE INSTRUCTIONS  Activity: Rest for the remainder of the day.  Do not drive or operate equipment today.  You may resume normal activities in one to two days as instructed by your physician.   Meals: Drink plenty of liquids and eat light foods such as gelatin or soup this evening.  You may return to a normal meal plan tomorrow.  Return to Work: You may return to work in one to two days or as instructed by your physician.  Special Instructions / Symptoms: Call your physician if any of these symptoms occur:   -persistent or heavy bleeding  -bleeding which continues after first few urination  -large blood clots that are difficult to pass  -urine stream diminishes or stops completely  -fever equal to or higher than 101 degrees Farenheit.  -cloudy urine with a strong, foul odor  -severe pain  Females should always wipe from front to back after elimination.  You may feel some burning pain when you urinate.  This should disappear with time.  Applying moist heat to the lower abdomen  or a hot tub bath may help relieve the pain. \  Follow-Up / Date of Return Visit to Your Physician:  *** Call for an appointment to arrange follow-up.  Patient Signature:  ________________________________________________________  Nurse's Signature:  ________________________________________________________ [facility] [phone] Post Ureteroscopy With or Without Stent Instructions  Definitions:  Ureter: The duct that transports urine from the kidney to the bladder. Stent:   A plastic hollow tube that is placed into the ureter, from the kidney to the                 bladder to prevent the ureter from swelling shut.  GENERAL INSTRUCTIONS:  Despite the fact that no skin incisions were used, the area around the ureter and bladder is raw and irritated. The stent is a foreign body which will further irritate the bladder wall. This irritation is manifested by increased frequency of urination, both day and night, and by an increase in the urge to urinate. In some, the urge to urinate is present almost always. Sometimes the urge is strong enough that you may not be able to stop yourself from urinating. The only real cure is to remove the stent and then give time for the bladder wall to heal which can't be done until the danger of the ureter swelling shut has passed, which varies.  You may see some blood in your urine while the stent is in place and a few days afterwards. Do not be alarmed, even if the urine was clear for a while. Get off your feet and drink lots of  fluids until clearing occurs. If you start to pass clots or don't improve, call us.  DIET: You may return to your normal diet immediately. Because of the raw surface of your bladder, alcohol, spicy foods, acid type foods and drinks with caffeine may cause irritation or frequency and should be used in moderation. To keep your urine flowing freely and to avoid constipation, drink plenty of fluids during the day ( 8-10  glasses ). Tip: Avoid cranberry juice because it is very acidic.  ACTIVITY: Your physical activity doesn't need to be restricted. However, if you are very active, you may see some blood in your urine. We suggest that you reduce your activity under these circumstances until the bleeding has stopped.  BOWELS: It is important to keep your bowels regular during the postoperative period. Straining with bowel movements can cause bleeding. A bowel movement every other day is reasonable. Use a mild laxative if needed, such as Milk of Magnesia 2-3 tablespoons, or 2 Dulcolax tablets. Call if you continue to have problems. If you have been taking narcotics for pain, before, during or after your surgery, you may be constipated. Take a laxative if necessary.   MEDICATION: You should resume your pre-surgery medications unless told not to. In addition you will often be given an antibiotic to prevent infection. These should be taken as prescribed until the bottles are finished unless you are having an unusual reaction to one of the drugs.  PROBLEMS YOU SHOULD REPORT TO [place]:  Fevers over 100.5 Fahrenheit.  Heavy bleeding, or clots ( See above notes about blood in urine ).  Inability to urinate.  Drug reactions ( hives, rash, nausea, vomiting, diarrhea ).  Severe burning or pain with urination that is not improving.  FOLLOW-UP: You will need a follow-up appointment to monitor your progress. Call for this appointment at the number listed above. Usually the first appointment will be about three to fourteen days after your surgery.

## 2014-04-23 NOTE — H&P (Signed)
History of Present Illness  Christopher Colon was seen in the ER last night for severe left flank pain associated with gross hematuria. He has been having pain for 2 weeks. He had a CT scan that showed a 5 mm left mid ureteral calculus with mild hydronephrosis and bilateral small renal calculi. He was prescribed analgesics and tamsulosin. He has mild discomfort at this time. Creatinine is 0.93.    1 Amended By: Lowella Bandy; Apr 23 2014 8:10 AM EST  Past Medical History Problems  1. History of renal calculi (V13.01)  Surgical History Problems  1. History of Arm Incision 2. History of Hand Surgery 3. History of Knee Surgery 4. History of Shoulder Surgery  Current Meds 1. Cyclobenzaprine HCl - 10 MG Oral Tablet;  Therapy: (Recorded:26Jun2015) to Recorded 2. Oxycodone-Acetaminophen 5-325 MG Oral Tablet;  Therapy: (Recorded:26Jun2015) to Recorded 3. Tamsulosin HCl - 0.4 MG Oral Capsule;  Therapy: (Recorded:26Jun2015) to Recorded  Allergies Medication  1. No Known Drug Allergies  Family History Problems  1. Family history of acute myocardial infarction (V17.3) : Father  Social History Problems    Denied: History of Alcohol use   Caffeine use (V49.89)   2   Father deceased   12 heart attack   Former smoker [place])   History of smoking (V15.82)   smoked 1 pk for 30 yrs/ quit 3 months ago   Married   Occupation   Agricultural consultant  Review of Systems Genitourinary, constitutional, skin, eye, otolaryngeal, hematologic/lymphatic, cardiovascular, pulmonary, endocrine, musculoskeletal, gastrointestinal, neurological and psychiatric system(s) were reviewed and pertinent findings if present are noted.  Genitourinary: nocturia, hematuria and penile pain.  Gastrointestinal: nausea.  Musculoskeletal: back pain.  Neurological: headache.    Vitals Vital Signs [Data Includes: Last 1 Day]  Recorded: 26Jun2015 01:21PM  Height: 5 ft 9.5 in Weight: 196 lb  BMI Calculated: 28.53 BSA  Calculated: 2.06 Blood Pressure: 124 / 84 Heart Rate: 85 Respiration: 18  Physical Exam Constitutional: Well nourished and well developed . No acute distress.  ENT:. The ears and nose are normal in appearance.  Neck: The appearance of the neck is normal and no neck mass is present.  Pulmonary: No respiratory distress and normal respiratory rhythm and effort.  Cardiovascular: Heart rate and rhythm are normal . No peripheral edema.  Abdomen: The abdomen is soft and nontender. No masses are palpated. No CVA tenderness. No hernias are palpable. No hepatosplenomegaly noted.  Genitourinary: Examination of the penis demonstrates no discharge, no masses, no lesions and a normal meatus. The scrotum is without lesions. The right epididymis is palpably normal and non-tender. The left epididymis is palpably normal and non-tender. The right testis is non-tender and without masses. The left testis is non-tender and without masses.  Lymphatics: The femoral and inguinal nodes are not enlarged or tender.  Skin: Normal skin turgor, no visible rash and no visible skin lesions.  Neuro/Psych:. Mood and affect are appropriate.    Results/Data Urine [Data Includes: Last 1 Day]   [id number]  COLOR YELLOW   APPEARANCE CLEAR   SPECIFIC GRAVITY 1.025   pH 6.0   GLUCOSE NEG mg/dL  BILIRUBIN NEG   KETONE NEG mg/dL  BLOOD TRACE   PROTEIN NEG mg/dL  UROBILINOGEN 0.2 mg/dL  NITRITE NEG   LEUKOCYTE ESTERASE NEG   SQUAMOUS EPITHELIAL/HPF RARE   WBC 0-2 WBC/hpf  RBC 0-2 RBC/hpf  BACTERIA RARE   CRYSTALS NONE SEEN   CASTS NONE SEEN    I independently reviewed the CT scan  and the findings are as noted above.   Assessment Assessed  1. Urinary tract infection (599.0) 2. Calculus of distal left ureter (592.1) 3. Nephrolithiasis (592.0)  Plan Health Maintenance  1. UA With REFLEX; [Do Not Release]; Status:Resulted - Requires Verification;   Done:  [id number] 01:01PM Urinary tract infection  2. URINE  CULTURE; Status:Hold For - Specimen/Data Collection,Appointment; Requested  IBB:[id number];   Urine culture. Treat if culture is positive with culture specific antibiotics. Continue medical expulsive therapy. Cystoscopy, stone manipulation if he does not pass the stone. The procedure, risks, benefits were explained to the patient and his wife. The risks include but are not limited to hemorrhage, infection, ureteral injury, inability to find or extract the stone. They understand and are agreeable with the treatment plan.

## 2014-04-23 NOTE — Op Note (Signed)
Chayne Baumgart is a 43 y.o.   04/23/2014  General  Preoperative diagnosis: Left mid ureteral calculus. Left hydronephrosis  Postop diagnosis: Same  Procedure done: Cystoscopy, left retrograde pyelogram, ureteroscopy, holmium laser left mid ureteral calculus, stone extraction, insertion of double-J stent  Surgeon: Charlene Brooke. Nesi  Anesthesia: General  Indication: Patient is a 43 years old male who was seen in the ER 5 days ago with a sudden onset of severe left flank pain associated with gross hematuria.  CT scan showed a 5 mm left mid ureteral calculus with mild hydronephrosis. He was sent home on analgesics and tamsulosin. I saw him in the office the next day and had mild discomfort at that time. He has not passed the stone. He is now scheduled for cystoscopy, stone manipulation.  Procedure: Patient was identified by his wrist band and proper timeout was taken.  Under general anesthesia he was prepped and draped and placed in the dorsolithotomy position. A panendoscope was inserted in the bladder. The urethra is normal. There is minimal prostatic hypertrophy. The bladder mucosa is normal. There is no stone or tumor in the bladder. The ureteral orifices are in normal position and shape.  Left retrograde pyelogram:  A cone-tip catheter was passed through the cystoscope and the left ureteral orifice. Contrast was injected through the cone-tip catheter. There is a filling defect in the mid ureter at the upper level of the sacrum. The ureter proximal to the filling defect is mildly dilated. The cone-tip catheter was removed. A sensor wire was passed through the cystoscope and the left ureter.  A digital ureteroscope access sheath was passed over the sensor wire and advanced up to the level of the stone. The sensor wire and the inner sheath of the ureteroscope access sheath were removed. A digital ureteroscope was then passed through the access sheath. The stone was visualized. With a 200 microfiber  holmium laser the stone was fragmented in smaller fragments. With a Nitinol basket the stone fragments were removed out of the ureter. There were no more stone fragments in the ureter. The sensor wire was then passed through the ureteroscope access sheath and the inner sheath of the ureteroscope access sheath was passed over the sensor wire. The sensor wire was then removed. Contrast was then injected through the access sheath. The proximal ureter is mildly dilated. The renal pelvis and the calyces are normal. There is no extravasation of contrast. The sensor wire was then passed through the access sheath and the access sheath was removed. The sensor wire was then backloaded into the cystoscope and a #[id number] French-[id number] double-J stent was passed over the sensor wire. When the tip of the double-J stent was noted in the renal pelvis the sensor wire was removed. The proximal curl of the double-J stent is in the renal pelvis. The distal curl is in the bladder. The bladder was then emptied and the cystoscope removed.  Patient tolerated the procedure well and left the or in satisfactory condition to postanesthesia care unit.

## 2014-04-23 NOTE — Anesthesia Procedure Notes (Signed)
Procedure Name: LMA Insertion Date/Time: 04/23/2014 11:19 AM Performed by: Bethena Roys T Pre-anesthesia Checklist: Patient identified, Emergency Drugs available, Suction available and Patient being monitored Patient Re-evaluated:Patient Re-evaluated prior to inductionOxygen Delivery Method: Circle System Utilized Preoxygenation: Pre-oxygenation with 100% oxygen Intubation Type: IV induction Ventilation: Mask ventilation without difficulty LMA: LMA inserted LMA Size: 5.0 Number of attempts: 1 Airway Equipment and Method: bite block Placement Confirmation: positive ETCO2 Dental Injury: Teeth and Oropharynx as per pre-operative assessment

## 2014-04-23 NOTE — Transfer of Care (Signed)
Immediate Anesthesia Transfer of Care Note  Patient: Christopher Colon  Procedure(s) Performed: Procedure(s): CYSTOSCOPY WITH RETROGRADE PYELOGRAM, DIGITAL URETEROSCOPY REMOVAL LT STONE WITH BASKET AND STENT PLACEMENT (Left) HOLMIUM LASER APPLICATION (Left)  Patient Location: PACU  Anesthesia Type:General  Level of Consciousness: sedated and responds to stimulation  Airway & Oxygen Therapy: Patient Spontanous Breathing and Patient connected to nasal cannula oxygen  Post-op Assessment: Report given to PACU RN  Post vital signs: Reviewed and stable  Complications: No apparent anesthesia complications

## 2014-04-23 NOTE — Anesthesia Preprocedure Evaluation (Addendum)
Anesthesia Evaluation  Patient identified by MRN, date of birth, ID band Patient awake    Reviewed: Allergy & Precautions, H&P , NPO status , Patient's Chart, lab work & pertinent test results  Airway Mallampati: II TM Distance: >3 FB Neck ROM: full    Dental no notable dental hx. (+) Teeth Intact, Dental Advisory Given   Pulmonary neg pulmonary ROS, former smoker,  breath sounds clear to auscultation  Pulmonary exam normal       Cardiovascular Exercise Tolerance: Good negative cardio ROS  Rhythm:regular Rate:Normal     Neuro/Psych  Headaches, Anxiety Depression negative neurological ROS  negative psych ROS   GI/Hepatic negative GI ROS, Neg liver ROS,   Endo/Other  negative endocrine ROS  Renal/GU negative Renal ROS  negative genitourinary   Musculoskeletal   Abdominal   Peds  Hematology negative hematology ROS (+)   Anesthesia Other Findings   Reproductive/Obstetrics negative OB ROS                          Anesthesia Physical Anesthesia Plan  ASA: II  Anesthesia Plan: General   Post-op Pain Management:    Induction: Intravenous  Airway Management Planned: LMA  Additional Equipment:   Intra-op Plan:   Post-operative Plan:   Informed Consent: I have reviewed the patients History and Physical, chart, labs and discussed the procedure including the risks, benefits and alternatives for the proposed anesthesia with the patient or authorized representative who has indicated his/her understanding and acceptance.   Dental Advisory Given  Plan Discussed with: CRNA and Surgeon  Anesthesia Plan Comments:         Anesthesia Quick Evaluation

## 2014-04-24 ENCOUNTER — Encounter (HOSPITAL_BASED_OUTPATIENT_CLINIC_OR_DEPARTMENT_OTHER): Payer: Self-pay | Admitting: Urology

## 2014-06-15 ENCOUNTER — Other Ambulatory Visit: Payer: Self-pay | Admitting: Internal Medicine

## 2014-06-30 ENCOUNTER — Other Ambulatory Visit: Payer: Self-pay | Admitting: Internal Medicine

## 2014-07-02 NOTE — Telephone Encounter (Signed)
Medication last filled 09/2013--please advise

## 2014-07-02 NOTE — Telephone Encounter (Signed)
She will need OV before refill can be given

## 2014-07-02 NOTE — Telephone Encounter (Signed)
Please advise as pt has not had OV with you since 07/2013 and this medica

## 2014-07-10 ENCOUNTER — Other Ambulatory Visit: Payer: Self-pay | Admitting: Internal Medicine

## 2014-08-28 ENCOUNTER — Other Ambulatory Visit: Payer: Self-pay

## 2014-08-28 DIAGNOSIS — G8929 Other chronic pain: Secondary | ICD-10-CM

## 2014-08-28 DIAGNOSIS — R51 Headache: Principal | ICD-10-CM

## 2015-01-16 ENCOUNTER — Telehealth: Payer: Self-pay | Admitting: Internal Medicine

## 2015-01-16 NOTE — Telephone Encounter (Signed)
Pt is normally a pt of [facility] but has a visit scheduled with Dr Darnell Level on Monday and his wife wanted to make sure that Dr Darnell Level is aware that he is a recovering addict and does not want to be prescribed anything controlled such as a narcotic etc.

## 2015-01-16 NOTE — Telephone Encounter (Signed)
Noted  

## 2015-01-20 ENCOUNTER — Encounter: Payer: Self-pay | Admitting: Family Medicine

## 2015-01-20 ENCOUNTER — Ambulatory Visit (INDEPENDENT_AMBULATORY_CARE_PROVIDER_SITE_OTHER): Payer: PRIVATE HEALTH INSURANCE | Admitting: Family Medicine

## 2015-01-20 VITALS — BP 100/64 | HR 82 | Temp 98.2°F | Wt 200.0 lb

## 2015-01-20 DIAGNOSIS — M79609 Pain in unspecified limb: Secondary | ICD-10-CM | POA: Insufficient documentation

## 2015-01-20 DIAGNOSIS — M79639 Pain in unspecified forearm: Secondary | ICD-10-CM

## 2015-01-20 MED ORDER — SUMATRIPTAN SUCCINATE 100 MG PO TABS: 100.0000 mg | ORAL_TABLET | ORAL | Status: AC | PRN

## 2015-01-20 MED ORDER — NAPROXEN 500 MG PO TABS: ORAL_TABLET | ORAL | Status: DC

## 2015-01-20 MED ORDER — CYCLOBENZAPRINE HCL 10 MG PO TABS: 10.0000 mg | ORAL_TABLET | Freq: Two times a day (BID) | ORAL | Status: DC | PRN

## 2015-01-20 NOTE — Assessment & Plan Note (Addendum)
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 Treat with prescription naprosyn as well as placed in tennis elbow strap.  Provided with stretching exercises from [facility] pt advisor. Update if not improved with above treatment plan. Given description of muscle pain as myalgia - will also check labwork to r/o other cause (CPK, TSH, K).

## 2015-01-20 NOTE — Progress Notes (Signed)
   BP 100/64 mmHg  Pulse 82  Temp(Src) 98.2 F (36.8 C)  Wt 200 lb (90.719 kg)   CC: myalgias  Subjective:    Patient ID: Christopher Colon, male    DOB: 19-Jun-1971, 44 y.o.   MRN: [id number]  HPI: Christopher Colon is a 44 y.o. male presenting on 01/20/2015 for Arm Pain   1 mo h/o myalgias. Started at R forearm. Feels R forearm staying stiff/sore like he just worked out, but hasn't been working out recently. This has spread to L forearm then R upper arm.   No fevers/chills, new rashes, no other muscles involved, abd pain, nausea, no recent cold sxs. No numbness or weakness of arms. No skin changes otherwise either.  Flexeril helps sleep.   Recovering addict - doesn't want pain medication if he can avoid it.   Migraines - takes imitrex prn abortively. None in last few months. Saw chiropractor which helped. Narcotics for kidney stones.   Relevant past medical, surgical, family and social history reviewed and updated as indicated. Interim medical history since our last visit reviewed. Allergies and medications reviewed and updated. Current Outpatient Prescriptions on File Prior to Visit  Medication Sig  . HYDROcodone-acetaminophen (NORCO) 5-325 MG per tablet Take 1 tablet by mouth every 6 (six) hours as needed for moderate pain.  Marland Kitchen oxyCODONE-acetaminophen (PERCOCET/ROXICET) 5-325 MG per tablet Take 1-2 tablets by mouth every 4 (four) hours as needed for severe pain.  . naproxen sodium (ANAPROX) 220 MG tablet Take 440 mg by mouth every 6 (six) hours as needed (for pain).  . tamsulosin (FLOMAX) 0.4 MG CAPS capsule Take 1 capsule (0.4 mg total) by mouth daily after supper. (Patient not taking: Reported on 01/20/2015)   No current facility-administered medications on file prior to visit.    Review of Systems Per HPI unless specifically indicated above     Objective:    BP 100/64 mmHg  Pulse 82  Temp(Src) 98.2 F (36.8 C)  Wt 200 lb (90.719 kg)  Wt Readings from Last 3 Encounters:    01/20/15 200 lb (90.719 kg)  04/23/14 192 lb 8 oz (87.317 kg)  04/18/14 197 lb (89.359 kg)    Physical Exam  Constitutional: He appears well-developed and well-nourished. No distress.  Musculoskeletal: He exhibits no edema.  Tender to palpation throughout bilateral forearms and anterior R upper arm. predominantly tender R lateral and medial epicondyles. Tenderness elicited with forced extension against resistance  Skin: Skin is warm and dry. No rash noted.  Psychiatric: He has a normal mood and affect.  Nursing note and vitals reviewed.      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 Treat with prescription naprosyn as well as placed in tennis elbow strap.  Provided with stretching exercises from [facility] pt advisor. Update if not improved with above treatment plan. Given description of muscle pain as myalgia - will also check labwork to r/o other cause (CPK, TSH, K).      Relevant Orders   CK   Sedimentation rate   TSH   Basic metabolic panel       Follow up plan: No Follow-up on file.

## 2015-01-20 NOTE — Progress Notes (Signed)
Pre visit review using our clinic review tool, if applicable. No additional management support is needed unless otherwise documented below in the visit note. 

## 2015-01-20 NOTE — Patient Instructions (Addendum)
I think tennis elbow is causing pain - try tennis elbow strap as well as exercises provided today. We will also start anti inflammatory course (naprosyn sent to your pharmacy - don't take with ibuprofen or aleve). Labwork today as well. Let us know how you're doing with above treatment plan.

## 2015-01-21 ENCOUNTER — Telehealth: Payer: Self-pay

## 2015-01-21 LAB — BASIC METABOLIC PANEL
BUN: 11 mg/dL (ref 6–23)
CHLORIDE: 105 meq/L (ref 96–112)
CO2: 28 mEq/L (ref 19–32)
Calcium: 9.5 mg/dL (ref 8.4–10.5)
Creatinine, Ser: 0.91 mg/dL (ref 0.40–1.50)
GFR: 95.66 mL/min (ref >60.00)
Glucose, Bld: 74 mg/dL (ref 70–99)
POTASSIUM: 4.2 meq/L (ref 3.5–5.1)
Sodium: 139 mEq/L (ref 135–145)

## 2015-01-21 LAB — SEDIMENTATION RATE: Sed Rate: 8 mm/hr (ref 0–22)

## 2015-01-21 LAB — CK: Total CK: 155 U/L (ref 7–232)

## 2015-01-21 LAB — TSH: TSH: 1.69 u[IU]/mL (ref 0.35–4.50)

## 2015-01-21 MED ORDER — OLOPATADINE HCL 0.1 % OP SOLN: 1.0000 [drp] | Freq: Two times a day (BID) | OPHTHALMIC | Status: DC

## 2015-01-21 NOTE — Telephone Encounter (Signed)
Pt's wife donna left v/m; pt was seen 01/20/15 and pt thought eye drops for allergies were going to be sent to [facility]. Due to ins guidelines request 3 month supply.Please advise.

## 2015-01-21 NOTE — Telephone Encounter (Signed)
Patients wife notified

## 2015-01-21 NOTE — Telephone Encounter (Signed)
plz notify this was sent in today. 

## 2015-02-25 ENCOUNTER — Other Ambulatory Visit: Payer: Self-pay | Admitting: Family Medicine

## 2015-02-25 NOTE — Telephone Encounter (Signed)
I have not seen this pt in a year and a half. Even so, we do not give 90 days supplys for flexeril.

## 2016-11-09 ENCOUNTER — Emergency Department (HOSPITAL_COMMUNITY): Payer: PRIVATE HEALTH INSURANCE

## 2016-11-09 ENCOUNTER — Emergency Department (HOSPITAL_COMMUNITY)
Admission: EM | Admit: 2016-11-09 | Discharge: 2016-11-09 | Disposition: A | Discharge status: Home / Self Care | Payer: PRIVATE HEALTH INSURANCE | Attending: Emergency Medicine | Admitting: Emergency Medicine

## 2016-11-09 ENCOUNTER — Encounter (HOSPITAL_COMMUNITY): Payer: Self-pay | Admitting: *Deleted

## 2016-11-09 DIAGNOSIS — M79602 Pain in left arm: Secondary | ICD-10-CM | POA: Insufficient documentation

## 2016-11-09 DIAGNOSIS — M25522 Pain in left elbow: Secondary | ICD-10-CM | POA: Diagnosis not present

## 2016-11-09 DIAGNOSIS — Y9389 Activity, other specified: Secondary | ICD-10-CM | POA: Insufficient documentation

## 2016-11-09 DIAGNOSIS — Y999 Unspecified external cause status: Secondary | ICD-10-CM | POA: Insufficient documentation

## 2016-11-09 DIAGNOSIS — Y92410 Unspecified street and highway as the place of occurrence of the external cause: Secondary | ICD-10-CM | POA: Insufficient documentation

## 2016-11-09 DIAGNOSIS — S3991XA Unspecified injury of abdomen, initial encounter: Secondary | ICD-10-CM | POA: Diagnosis present

## 2016-11-09 DIAGNOSIS — R079 Chest pain, unspecified: Secondary | ICD-10-CM | POA: Insufficient documentation

## 2016-11-09 DIAGNOSIS — Z87891 Personal history of nicotine dependence: Secondary | ICD-10-CM | POA: Insufficient documentation

## 2016-11-09 DIAGNOSIS — R4182 Altered mental status, unspecified: Secondary | ICD-10-CM | POA: Diagnosis not present

## 2016-11-09 DIAGNOSIS — R109 Unspecified abdominal pain: Secondary | ICD-10-CM | POA: Insufficient documentation

## 2016-11-09 LAB — URINALYSIS, ROUTINE W REFLEX MICROSCOPIC
Bilirubin Urine: NEGATIVE
GLUCOSE, UA: NEGATIVE mg/dL
HGB URINE DIPSTICK: NEGATIVE
Ketones, ur: NEGATIVE mg/dL
Leukocytes, UA: NEGATIVE
Nitrite: NEGATIVE
PH: 8 (ref 5.0–8.0)
PROTEIN: NEGATIVE mg/dL
SPECIFIC GRAVITY, URINE: 1.023 (ref 1.005–1.030)

## 2016-11-09 LAB — PROTIME-INR
INR: 1.07
PROTHROMBIN TIME: 13.9 s (ref 11.4–15.2)

## 2016-11-09 LAB — COMPREHENSIVE METABOLIC PANEL
ALBUMIN: 4.5 g/dL (ref 3.5–5.0)
ALT: 40 U/L (ref 17–63)
ANION GAP: 9 (ref 5–15)
AST: 28 U/L (ref 15–41)
Alkaline Phosphatase: 51 U/L (ref 38–126)
BUN: 10 mg/dL (ref 6–20)
CO2: 25 mmol/L (ref 22–32)
Calcium: 9.2 mg/dL (ref 8.9–10.3)
Chloride: 103 mmol/L (ref 101–111)
Creatinine, Ser: 0.8 mg/dL (ref 0.61–1.24)
GFR calc Af Amer: >60 mL/min (ref >60)
GFR calc non Af Amer: >60 mL/min (ref >60)
GLUCOSE: 108 mg/dL — AB (ref 65–99)
Potassium: 3.4 mmol/L — ABNORMAL LOW (ref 3.5–5.1)
SODIUM: 137 mmol/L (ref 135–145)
Total Bilirubin: 0.7 mg/dL (ref 0.3–1.2)
Total Protein: 7.8 g/dL (ref 6.5–8.1)

## 2016-11-09 LAB — I-STAT CHEM 8, ED
BUN: 9 mg/dL (ref 6–20)
CREATININE: 0.8 mg/dL (ref 0.61–1.24)
Calcium, Ion: 1.12 mmol/L — ABNORMAL LOW (ref 1.15–1.40)
Chloride: 104 mmol/L (ref 101–111)
Glucose, Bld: 103 mg/dL — ABNORMAL HIGH (ref 65–99)
HEMATOCRIT: 43 % (ref 39.0–52.0)
HEMOGLOBIN: 14.6 g/dL (ref 13.0–17.0)
POTASSIUM: 3.6 mmol/L (ref 3.5–5.1)
Sodium: 142 mmol/L (ref 135–145)
TCO2: 24 mmol/L (ref 0–100)

## 2016-11-09 LAB — CBC
HCT: 44.6 % (ref 39.0–52.0)
HEMOGLOBIN: 15.3 g/dL (ref 13.0–17.0)
MCH: 30.4 pg (ref 26.0–34.0)
MCHC: 34.3 g/dL (ref 30.0–36.0)
MCV: 88.5 fL (ref 78.0–100.0)
Platelets: 240 10*3/uL (ref 150–400)
RBC: 5.04 MIL/uL (ref 4.22–5.81)
RDW: 13.4 % (ref 11.5–15.5)
WBC: 9.8 10*3/uL (ref 4.0–10.5)

## 2016-11-09 LAB — SAMPLE TO BLOOD BANK

## 2016-11-09 LAB — ETHANOL

## 2016-11-09 MED ORDER — TRAMADOL HCL 50 MG PO TABS: 50.0000 mg | ORAL_TABLET | Freq: Four times a day (QID) | ORAL | 0 refills | Status: DC | PRN

## 2016-11-09 MED ORDER — FENTANYL CITRATE (PF) 100 MCG/2ML IJ SOLN
100.0000 ug | Freq: Once | INTRAMUSCULAR | Status: AC
  Administered 2016-11-09: 100 ug via INTRAVENOUS
  Filled 2016-11-09: qty 2

## 2016-11-09 MED ORDER — IOPAMIDOL (ISOVUE-300) INJECTION 61%
100.0000 mL | Freq: Once | INTRAVENOUS | Status: AC | PRN
  Administered 2016-11-09: 100 mL via INTRAVENOUS

## 2016-11-09 MED ORDER — SODIUM CHLORIDE 0.9 % IV BOLUS (SEPSIS)
500.0000 mL | Freq: Once | INTRAVENOUS | Status: AC
  Administered 2016-11-09: 500 mL via INTRAVENOUS

## 2016-11-09 MED ORDER — FENTANYL CITRATE (PF) 100 MCG/2ML IJ SOLN
50.0000 ug | Freq: Once | INTRAMUSCULAR | Status: AC
  Administered 2016-11-09: 50 ug via INTRAVENOUS
  Filled 2016-11-09: qty 2

## 2016-11-09 NOTE — ED Notes (Signed)
Pt states understanding of care given and follow up instructions.  Pt ambulated from ED with SO

## 2016-11-09 NOTE — ED Notes (Signed)
Wife called per pt request and advised her about pt status. States she is on way
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
## 2016-11-09 NOTE — ED Notes (Signed)
Dr Dayna Barker at bedside, pt removed from lsb, c-collar remains in place, cms intact all extremities per and post removal of lsb, one silver colored ring removed from 3rd ring finger on left hand, placed in specimen cup and labeled, clothes removed, wallet left in pants pocket and all belongings placed in belonging bag at bedside with pt's label, verified with Marin Olp
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
## 2021-08-14 ENCOUNTER — Encounter: Payer: Self-pay | Admitting: Urology

## 2021-08-14 ENCOUNTER — Other Ambulatory Visit: Payer: Self-pay

## 2021-08-14 ENCOUNTER — Ambulatory Visit (INDEPENDENT_AMBULATORY_CARE_PROVIDER_SITE_OTHER): Payer: Managed Care, Other (non HMO) | Admitting: Urology

## 2021-08-14 VITALS — BP 172/102 | HR 87 | Temp 98.5°F | Wt 204.0 lb

## 2021-08-14 DIAGNOSIS — R3129 Other microscopic hematuria: Secondary | ICD-10-CM

## 2021-08-14 DIAGNOSIS — N200 Calculus of kidney: Secondary | ICD-10-CM

## 2021-08-14 DIAGNOSIS — N451 Epididymitis: Secondary | ICD-10-CM

## 2021-08-14 DIAGNOSIS — N433 Hydrocele, unspecified: Secondary | ICD-10-CM | POA: Diagnosis not present

## 2021-08-14 LAB — URINALYSIS, ROUTINE W REFLEX MICROSCOPIC
Bilirubin, UA: NEGATIVE
Glucose, UA: NEGATIVE
Ketones, UA: NEGATIVE
Leukocytes,UA: NEGATIVE
Nitrite, UA: NEGATIVE
Protein,UA: NEGATIVE
Specific Gravity, UA: 1.02 (ref 1.005–1.030)
Urobilinogen, Ur: 0.2 mg/dL (ref 0.2–1.0)
pH, UA: 5.5 (ref 5.0–7.5)

## 2021-08-14 LAB — MICROSCOPIC EXAMINATION
Bacteria, UA: NONE SEEN
RBC: >30 /hpf — AB (ref 0–2)
Renal Epithel, UA: NONE SEEN /hpf
WBC, UA: NONE SEEN /hpf (ref 0–5)

## 2021-08-14 MED ORDER — DOXYCYCLINE HYCLATE 100 MG PO CAPS: 100.0000 mg | ORAL_CAPSULE | Freq: Two times a day (BID) | ORAL | 0 refills | Status: AC

## 2021-08-14 MED ORDER — MELOXICAM 7.5 MG PO TABS: 7.5000 mg | ORAL_TABLET | Freq: Every day | ORAL | 1 refills | Status: DC
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
## 2021-08-14 NOTE — Progress Notes (Signed)
Urological Symptom Review  Patient is experiencing the following symptoms: Frequent urination Burning/pain with urination   Review of Systems  Gastrointestinal (upper)  : Negative for upper GI symptoms  Gastrointestinal (lower) : Negative for lower GI symptoms  Constitutional : Negative for symptoms  Skin: Negative for skin symptoms  Eyes: Negative for eye symptoms  Ear/Nose/Throat : Negative for Ear/Nose/Throat symptoms  Hematologic/Lymphatic: Negative for Hematologic/Lymphatic symptoms  Cardiovascular : Negative for cardiovascular symptoms  Respiratory : Negative for respiratory symptoms  Endocrine: Negative for endocrine symptoms  Musculoskeletal: Negative for musculoskeletal symptoms  Neurological: Negative for neurological symptoms  Psychologic: Negative for psychiatric symptoms

## 2021-08-25 ENCOUNTER — Ambulatory Visit (INDEPENDENT_AMBULATORY_CARE_PROVIDER_SITE_OTHER): Payer: Managed Care, Other (non HMO) | Admitting: Urology

## 2021-08-25 ENCOUNTER — Encounter: Payer: Self-pay | Admitting: Urology

## 2021-08-25 ENCOUNTER — Other Ambulatory Visit: Payer: Self-pay

## 2021-08-25 VITALS — BP 153/92 | HR 72 | Temp 98.3°F

## 2021-08-25 DIAGNOSIS — N451 Epididymitis: Secondary | ICD-10-CM

## 2021-08-25 DIAGNOSIS — N433 Hydrocele, unspecified: Secondary | ICD-10-CM

## 2021-08-25 DIAGNOSIS — R3129 Other microscopic hematuria: Secondary | ICD-10-CM

## 2021-08-25 DIAGNOSIS — N200 Calculus of kidney: Secondary | ICD-10-CM

## 2021-08-25 LAB — URINALYSIS, ROUTINE W REFLEX MICROSCOPIC
Bilirubin, UA: NEGATIVE
Glucose, UA: NEGATIVE
Ketones, UA: NEGATIVE
Leukocytes,UA: NEGATIVE
Nitrite, UA: NEGATIVE
Protein,UA: NEGATIVE
RBC, UA: NEGATIVE
Specific Gravity, UA: 1.025 (ref 1.005–1.030)
Urobilinogen, Ur: 0.2 mg/dL (ref 0.2–1.0)
pH, UA: 5.5 (ref 5.0–7.5)

## 2021-08-25 NOTE — Progress Notes (Signed)
Urological Symptom Review  Patient is experiencing the following symptoms: Get up at night to urinate Stream starts and stops   Review of Systems  Gastrointestinal (upper)  : Negative for upper GI symptoms  Gastrointestinal (lower) : Negative for lower GI symptoms  Constitutional : Negative for symptoms  Skin: Negative for skin symptoms  Eyes: Negative for eye symptoms  Ear/Nose/Throat : Negative for Ear/Nose/Throat symptoms  Hematologic/Lymphatic: Negative for Hematologic/Lymphatic symptoms  Cardiovascular : Negative for cardiovascular symptoms  Respiratory : Negative for respiratory symptoms  Endocrine: Negative for endocrine symptoms  Musculoskeletal: Negative for musculoskeletal symptoms  Neurological: Negative for neurological symptoms  Psychologic: Negative for psychiatric symptoms 
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
## 2021-08-27 NOTE — Progress Notes (Signed)
Letter printed and mailed.  

## 2021-10-06 ENCOUNTER — Encounter: Payer: Self-pay | Admitting: *Deleted

## 2021-10-22 ENCOUNTER — Other Ambulatory Visit: Payer: Self-pay | Admitting: Urology

## 2021-10-28 ENCOUNTER — Ambulatory Visit: Payer: PRIVATE HEALTH INSURANCE | Admitting: Urology

## 2021-10-29 ENCOUNTER — Encounter: Payer: Self-pay | Admitting: Urology

## 2021-10-29 ENCOUNTER — Other Ambulatory Visit: Payer: Self-pay

## 2021-10-29 ENCOUNTER — Ambulatory Visit (INDEPENDENT_AMBULATORY_CARE_PROVIDER_SITE_OTHER): Payer: Managed Care, Other (non HMO) | Admitting: Urology

## 2021-10-29 VITALS — BP 161/90 | HR 91 | Ht 69.0 in | Wt 202.0 lb

## 2021-10-29 DIAGNOSIS — R3129 Other microscopic hematuria: Secondary | ICD-10-CM | POA: Diagnosis not present

## 2021-10-29 DIAGNOSIS — N200 Calculus of kidney: Secondary | ICD-10-CM

## 2021-10-29 DIAGNOSIS — N433 Hydrocele, unspecified: Secondary | ICD-10-CM

## 2021-10-29 NOTE — Progress Notes (Signed)
Urological Symptom Review  Patient is experiencing the following symptoms: Get up at night to urinate Stream starts and stops Kidney stones  Review of Systems  Gastrointestinal (upper)  : Negative for upper GI symptoms  Gastrointestinal (lower) : Negative for lower GI symptoms  Constitutional : Negative for symptoms  Skin: Negative for skin symptoms  Eyes: Negative for eye symptoms  Ear/Nose/Throat : Negative for Ear/Nose/Throat symptoms  Hematologic/Lymphatic: Negative for Hematologic/Lymphatic symptoms  Cardiovascular : Negative for cardiovascular symptoms  Respiratory : Negative for respiratory symptoms  Endocrine: Negative for endocrine symptoms  Musculoskeletal: Negative for musculoskeletal symptoms  Neurological: Negative for neurological symptoms  Psychologic: Negative for psychiatric symptoms
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
## 2021-11-03 LAB — CALCULI, WITH PHOTOGRAPH (CLINICAL LAB)
Calcium Oxalate Dihydrate: 50 %
Calcium Oxalate Monohydrate: 50 %
Weight Calculi: 22 mg

## 2021-11-06 ENCOUNTER — Ambulatory Visit (HOSPITAL_COMMUNITY)
Admission: RE | Admit: 2021-11-06 | Discharge: 2021-11-06 | Disposition: A | Discharge status: Home / Self Care | Payer: Managed Care, Other (non HMO) | Source: Ambulatory Visit | Attending: Urology | Admitting: Urology

## 2021-11-06 ENCOUNTER — Other Ambulatory Visit: Payer: Self-pay

## 2021-11-06 DIAGNOSIS — N433 Hydrocele, unspecified: Secondary | ICD-10-CM | POA: Insufficient documentation

## 2021-11-06 DIAGNOSIS — N200 Calculus of kidney: Secondary | ICD-10-CM | POA: Insufficient documentation

## 2021-11-11 ENCOUNTER — Other Ambulatory Visit: Payer: Self-pay | Admitting: Urology

## 2021-11-11 ENCOUNTER — Telehealth: Payer: Self-pay

## 2021-11-11 DIAGNOSIS — N433 Hydrocele, unspecified: Secondary | ICD-10-CM

## 2021-11-11 MED ORDER — TRAMADOL HCL 50 MG PO TABS: 50.0000 mg | ORAL_TABLET | Freq: Four times a day (QID) | ORAL | 0 refills | Status: DC | PRN

## 2021-11-11 NOTE — Telephone Encounter (Signed)
Pt called said that he is having more swelling in testicle , pt had study done on 11/06/2021,  he would like to know the results and  know the the next course of treatment, he feel like the pain is getting worse and not better.  Please advise  [phone]

## 2021-11-12 NOTE — Telephone Encounter (Signed)
Called and spoke with patient arrange appt for patient to come to discuss surgery, pt has an appt for 11/17/21, pt is going to call back  to see if we have any sooner appt.

## 2021-11-16 ENCOUNTER — Encounter: Payer: Self-pay | Admitting: Urology

## 2021-11-16 ENCOUNTER — Ambulatory Visit (INDEPENDENT_AMBULATORY_CARE_PROVIDER_SITE_OTHER): Payer: Managed Care, Other (non HMO) | Admitting: Urology

## 2021-11-16 ENCOUNTER — Other Ambulatory Visit: Payer: Self-pay

## 2021-11-16 VITALS — BP 128/86 | HR 79 | Wt 202.0 lb

## 2021-11-16 DIAGNOSIS — N200 Calculus of kidney: Secondary | ICD-10-CM

## 2021-11-16 DIAGNOSIS — N433 Hydrocele, unspecified: Secondary | ICD-10-CM

## 2021-11-16 DIAGNOSIS — K4090 Unilateral inguinal hernia, without obstruction or gangrene, not specified as recurrent: Secondary | ICD-10-CM

## 2021-11-16 MED ORDER — NAPROXEN 500 MG PO TABS: 500.0000 mg | ORAL_TABLET | Freq: Two times a day (BID) | ORAL | 0 refills | Status: AC
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
## 2021-11-16 NOTE — Addendum Note (Signed)
Addended byIris Pert on: 11/16/2021 05:10 PM   Modules accepted: Orders

## 2021-11-17 ENCOUNTER — Ambulatory Visit: Payer: Managed Care, Other (non HMO) | Admitting: Urology

## 2021-11-17 LAB — URINALYSIS, ROUTINE W REFLEX MICROSCOPIC
Bilirubin, UA: NEGATIVE
Glucose, UA: NEGATIVE
Ketones, UA: NEGATIVE
Leukocytes,UA: NEGATIVE
Nitrite, UA: NEGATIVE
Protein,UA: NEGATIVE
RBC, UA: NEGATIVE
Specific Gravity, UA: <1.005 — ABNORMAL LOW (ref 1.005–1.030)
Urobilinogen, Ur: 0.2 mg/dL (ref 0.2–1.0)
pH, UA: 6 (ref 5.0–7.5)

## 2021-11-18 ENCOUNTER — Other Ambulatory Visit: Payer: Self-pay

## 2021-11-18 ENCOUNTER — Ambulatory Visit (INDEPENDENT_AMBULATORY_CARE_PROVIDER_SITE_OTHER): Payer: Managed Care, Other (non HMO) | Admitting: Surgery

## 2021-11-18 ENCOUNTER — Encounter: Payer: Self-pay | Admitting: Surgery

## 2021-11-18 VITALS — BP 138/97 | HR 77 | Temp 98.6°F | Resp 16 | Ht 69.0 in | Wt 201.0 lb

## 2021-11-18 DIAGNOSIS — N5082 Scrotal pain: Secondary | ICD-10-CM | POA: Diagnosis not present
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
## 2021-11-20 ENCOUNTER — Encounter: Payer: Self-pay | Admitting: *Deleted

## 2021-11-24 ENCOUNTER — Other Ambulatory Visit: Payer: Self-pay

## 2021-11-24 ENCOUNTER — Ambulatory Visit
Admission: RE | Admit: 2021-11-24 | Discharge: 2021-11-24 | Disposition: A | Discharge status: Home / Self Care | Payer: Managed Care, Other (non HMO) | Source: Ambulatory Visit | Attending: Surgery | Admitting: Surgery

## 2021-11-24 DIAGNOSIS — N5082 Scrotal pain: Secondary | ICD-10-CM

## 2021-11-24 MED ORDER — IOPAMIDOL (ISOVUE-300) INJECTION 61%
100.0000 mL | Freq: Once | INTRAVENOUS | Status: AC | PRN
  Administered 2021-11-24: 100 mL via INTRAVENOUS

## 2021-11-27 ENCOUNTER — Other Ambulatory Visit: Payer: Self-pay | Admitting: Urology

## 2021-11-27 ENCOUNTER — Telehealth (INDEPENDENT_AMBULATORY_CARE_PROVIDER_SITE_OTHER): Payer: Managed Care, Other (non HMO) | Admitting: Surgery

## 2021-11-27 DIAGNOSIS — K4090 Unilateral inguinal hernia, without obstruction or gangrene, not specified as recurrent: Secondary | ICD-10-CM

## 2021-11-27 DIAGNOSIS — N433 Hydrocele, unspecified: Secondary | ICD-10-CM

## 2021-11-27 NOTE — Telephone Encounter (Signed)
Called patient to discuss the results of his CT abdomen and pelvis.  We discussed that the imaging is demonstrating a small fat filled left inguinal hernia in addition to a large left hydrocele.  While not included in the read, I also note that there is a small right inguinal hernia as well, though patient has no complaints of pain on the right side.  The patient states that he is continuing to have left groin and scrotal pain and would like his hernia repaired and his hydrocele removed if possible.  I did explain to the patient that while, per Dr. Felipa Eth, it is abnormal for hydroceles to cause significant pain, it would also be abnormal for the small left inguinal hernia to be causing his pain.  However, given that he has the continued pain, Dr. Felipa Eth and I will coordinate a date to perform both the left inguinal hernia repair and left hydrocelectomy at the same time.  I advised the patient that my office will call him once a date has been set up.  He is agreeable, and is requesting that the surgery be prior to 12/23/2021.  I told him that we would attempt to schedule it prior to that date.  All of his questions were answered to his expressed satisfaction.  I verified that I was speaking with Christopher Colon. I was present in the hospital during the phone encounter.  A total of 5 minutes was spent on the phone with Christopher Colon.     Graciella Freer, DO [facility] [address] [place], [place] [address] [phone] (office)

## 2021-11-27 NOTE — Progress Notes (Signed)
Surgical Physician Order [facility]  * Scheduling expectation :  needs to be scheduled in conjunction with General Surgeon, Dr. Okey Dupre  *Length of Case: 60 minutes  *MD Preforming Case: Michaelle Birks, MD  *Assistant Needed: no  *Facility Preference: [facility]  *Clearance needed: no  *Anticoagulation Instructions: N/A  *Aspirin Instructions: N/A  -Admit type: OUTpatient  -Anesthesia: General  -Use Standing Orders:  none  *Diagnosis:  Left hydrocele  *Procedure: left  hydrocelectomy  Additional orders: N/A  -Equipment:  none -VTE Prophylaxis Standing Order SCDs       Other:   -Standing Lab Orders Per Anesthesia    Lab other: None  -Standing Test orders EKG/Chest x-ray per Anesthesia       Test other:   - Medications:  Ancef 2gm IV  -Other orders:  Maryln Manuel  *Post-op visit Date/Instructions:  1-3 day drain removal

## 2021-12-01 ENCOUNTER — Encounter: Payer: Self-pay | Admitting: Family Medicine

## 2021-12-01 NOTE — Progress Notes (Signed)
[facility] Surgery Posting Form   Surgery Date/Time: Date: 12/18/2021  Surgeon: Dr. Michaelle Birks, MD  Surgery Location: Day Surgery  Inpt ( No  )   Outpt (Yes)   Obs ( No  )   Diagnosis: Left Hydrocele N43.3  -CPT: 71292  Surgery: Left Hydrocelectomy  Stop Anticoagulations: No  Cardiac/Medical/Pulmonary Clearance needed: no  *Orders entered into EPIC  Date: 12/01/21   *Case booked in [place]  Date: 11/27/2021  *Notified pt of Surgery: Date: 11/27/2021  *Placed into Prior Authorization Work Fabio Bering Date: 12/01/21   Assistant/laser/rep:No

## 2021-12-02 ENCOUNTER — Telehealth: Payer: Self-pay | Admitting: *Deleted

## 2021-12-02 NOTE — Telephone Encounter (Signed)
Received faxed FMLA forms from [place] for [employer]~ 1 - [phone]~ fax  Surgery Scheduled: 12/18/2021- L inguinal hernia repair with Dr. Okey Dupre (Dx: K40.90) and L hydrocelectomy with Dr. Felipa Eth (Dx: N43.3).  Routed to [facility] coordinator.

## 2021-12-03 ENCOUNTER — Other Ambulatory Visit: Payer: Self-pay | Admitting: Urology

## 2021-12-03 ENCOUNTER — Telehealth: Payer: Self-pay

## 2021-12-03 DIAGNOSIS — N433 Hydrocele, unspecified: Secondary | ICD-10-CM

## 2021-12-03 MED ORDER — TRAMADOL HCL 50 MG PO TABS: 50.0000 mg | ORAL_TABLET | Freq: Four times a day (QID) | ORAL | 0 refills | Status: DC | PRN

## 2021-12-03 NOTE — Telephone Encounter (Signed)
Refill sent.

## 2021-12-03 NOTE — Telephone Encounter (Signed)
Received refill for tramadol rx. Message sent to provider

## 2021-12-09 ENCOUNTER — Other Ambulatory Visit: Payer: Managed Care, Other (non HMO)

## 2021-12-15 NOTE — Patient Instructions (Signed)
Christopher Colon  12/15/2021     @PREFPERIOPPHARMACY @   Your procedure is scheduled on  12/18/2021.   Report to [facility] at  0730  A.M.   Call this number if you have problems the morning of surgery:  [phone]   Remember:  Do not eat or drink after midnight.      Take these medicines the morning of surgery with A SIP OF WATER                     Imitrex(if needed), tramadol(if needed).     Do not wear jewelry, make-up or nail polish.  Do not wear lotions, powders, or perfumes, or deodorant.  Do not shave 48 hours prior to surgery.  Men may shave face and neck.  Do not bring valuables to the hospital.  [facility] is not responsible for any belongings or valuables.  Contacts, dentures or bridgework may not be worn into surgery.  Leave your suitcase in the car.  After surgery it may be brought to your room.  For patients admitted to the hospital, discharge time will be determined by your treatment team.  Patients discharged the day of surgery will not be allowed to drive home and must have someone with them for 24 hours.    Special instructions:   DO NOT smoke tobacco or vape for 24 hours before your procedure.  Please read over the following fact sheets that you were given. Coughing and Deep Breathing, Surgical Site Infection Prevention, Anesthesia Post-op Instructions, and Care and Recovery After Surgery      Hydrocelectomy, Adult, Care After The following information offers guidance on how to care for yourself after your procedure. Your health care provider may also give you more specific instructions. If you have problems or questions, contact your health care provider. What can I expect after the procedure? After your procedure, it is common to have: Mild discomfort and swelling in the pouch that holds your testicles (scrotum). Bruising of the scrotum. Follow these instructions at home: Medicines Take over-the-counter and prescription medicines  only as told by your health care provider. Ask your health care provider if the medicine prescribed to you: Requires you to avoid driving or using machinery. Can cause constipation. You may need to take these actions to prevent or treat constipation: Drink enough fluid to keep your urine pale yellow. Take over-the-counter or prescription medicines. Eat foods that are high in fiber, such as beans, whole grains, and fresh fruits and vegetables. Limit foods that are high in fat and processed sugars, such as fried or sweet foods. Bathing Do not take baths, swim, or use a hot tub until your health care provider approves. Ask your health care provider if you may take showers. You may only be allowed to take sponge baths. If you were told to wear an athletic support strap (scrotal support), keep it dry. Take it off when you shower or bathe. Incision care  Follow instructions from your health care provider about how to take care of your incision. Make sure you: Wash your hands with soap and water for at least 20 seconds before and after you change your bandage (dressing). If soap and water are not available, use hand sanitizer. Change your dressing as told by your health care provider. Leave stitches (sutures), skin glue, or adhesive strips in place. These skin closures may need to stay in place for 2 weeks or longer. If  adhesive strip edges start to loosen and curl up, you may trim the loose edges. Do not remove adhesive strips completely unless your health care provider tells you to do that. Check your incision and scrotum every day for signs of infection. Check for: More redness, swelling, or pain. Fluid or blood. Warmth. Pus or a bad smell. Managing pain and swelling If directed, put ice on the affected area. To do this: Put ice in a plastic bag. Place a towel between your skin and the bag. Leave the ice on for 20 minutes, 2-3 times per day. Remove the ice if your skin turns bright red. This  is very important. If you cannot feel pain, heat, or cold, you have a greater risk of damage to the area.  Activity Do not lift anything that is heavier than 10 lb (4.5 kg) until your health care provider says that it is safe. If you were given a sedative during the procedure, it can affect you for several hours. Do not drive or operate machinery until your health care provider says that it is safe. Ask your health care provider when it is safe to drive. Return to your normal activities as told by your health care provider. Ask your health care provider what activities are safe for you. General instructions Do not use any products that contain nicotine or tobacco. These products include cigarettes, chewing tobacco, and vaping devices, such as e-cigarettes. These can delay healing after surgery. If you need help quitting, ask your health care provider. If you were given a scrotal support, wear it as told by your health care provider. Keep all follow-up visits. This is important. If you had a drain put in during the procedure, you will need to have it removed at a follow-up visit. Contact a health care provider if: Your pain is not controlled with medicine. You have more redness, swelling, or pain around your scrotum. You have fluid or blood coming from your incision. Your incision feels warm to the touch. You have pus or a bad smell coming from your incision. You have a fever. Get help right away if: You develop shaking, chills, and a fever that is higher than 101.34F (38.8C). You have redness or swelling that starts at your scrotum and spreads outward to your whole groin. You develop swelling in your legs. You have difficulty breathing. These symptoms may be an emergency. Get help right away. Call 911. Do not wait to see if the symptoms will go away. Do not drive yourself to the hospital. Summary After a hydrocelectomy, it is common to have mild discomfort, swelling, and bruising. Do not  take baths, swim, or use a hot tub until your health care provider approves. Ask your health care provider if you may take showers. If directed, put ice on the affected area to help with pain and swelling. Do not lift anything that is heavier than 10 lb (4.5 kg) until your health care provider says that it is safe. Return to your normal activities as told by your health care provider. If you were given a scrotal support, keep it dry. Wear the scrotal support as told by your health care provider. This information is not intended to replace advice given to you by your health care provider. Make sure you discuss any questions you have with your health care provider. Document Revised: 05/28/2021 Document Reviewed: 05/28/2021 Elsevier Patient Education  2022 [place], Adult, Care After What can I expect after the procedure? After  the procedure, it is common to have: Mild discomfort. Slight bruising. Mild swelling. Pain in the belly (abdomen). A small amount of blood from the cut from surgery (incision). Follow these instructions at home: Your doctor may give you more specific instructions. If you have problems, call your doctor. Medicines Take over-the-counter and prescription medicines only as told by your doctor. If told, take steps to prevent problems with pooping (constipation). You may need to: Drink enough fluid to keep your pee (urine) pale yellow. Take medicines. You will be told what medicines to take. Eat foods that are high in fiber. These include beans, whole grains, and fresh fruits and vegetables. Limit foods that are high in fat and sugar. These include fried or sweet foods. Ask your doctor if you should avoid driving or using machines while you are taking your medicine. Incision care  Follow instructions from your doctor about how to take care of your incision. Make sure you: Wash your hands with soap and water for at least 20 seconds before and after  you change your bandage (dressing). If you cannot use soap and water, use hand sanitizer. Change your bandage. Leave stitches or skin glue in place for at least 2 weeks. Leave tape strips alone unless you are told to take them off. You may trim the edges of the tape strips if they curl up. Check your incision every day for signs of infection. Check for: More redness, swelling, or pain. More fluid or blood. Warmth. Pus or a bad smell. Wear loose, soft clothing while your incision heals. Activity  Rest as told by your doctor. Do not lift anything that is heavier than 10 lb (4.5 kg), or the limit that you are told. Do not play contact sports until your doctor says that this is safe. If you were given a sedative during your procedure, do not drive or use machines until your doctor says that it is safe. A sedative is a medicine that helps you relax. Return to your normal activities when your doctor says that it is safe. General instructions Do not take baths, swim, or use a hot tub. Ask your doctor about taking showers or sponge baths. Hold a pillow over your belly when you cough or sneeze. This helps with pain. Do not smoke or use any products that contain nicotine or tobacco. If you need help quitting, ask your doctor. Keep all follow-up visits. Contact a doctor if: You have any of these signs of infection in or around your incision: More redness, swelling, or pain. More fluid or blood. Warmth. Pus. A bad smell. You have a fever or chills. You have blood in your poop (stool). You have not pooped (had a bowel movement) in 2-3 days. Medicine does not help your pain. Get help right away if: You have chest pain, or you are short of breath. You feel faint or light-headed. You have very bad pain. You vomit and your pain is worse. You have pain, swelling, or redness in a leg. These symptoms may be an emergency. Get help right away. Call your local emergency services (911 in the  [place] Do not wait to see if the symptoms will go away. Do not drive yourself to the hospital. Summary After this procedure, it is common to have mild discomfort, slight bruising, and mild swelling. Follow instructions from your doctor about how to take care of your cut from surgery (incision). Check every day for signs of infection. Do not lift heavy objects or play contact  sports until your doctor says it is safe. Return to your normal activities as told by your doctor. This information is not intended to replace advice given to you by your health care provider. Make sure you discuss any questions you have with your health care provider. Document Revised: 05/26/2020 Document Reviewed: 05/26/2020 Elsevier Patient Education  2022 [place] Anesthesia, Adult, Care After This sheet gives you information about how to care for yourself after your procedure. Your health care provider may also give you more specific instructions. If you have problems or questions, contact your health care provider. What can I expect after the procedure? After the procedure, the following side effects are common: Pain or discomfort at the IV site. Nausea. Vomiting. Sore throat. Trouble concentrating. Feeling cold or chills. Feeling weak or tired. Sleepiness and fatigue. Soreness and body aches. These side effects can affect parts of the body that were not involved in surgery. Follow these instructions at home: For the time period you were told by your health care provider:  Rest. Do not participate in activities where you could fall or become injured. Do not drive or use machinery. Do not drink alcohol. Do not take sleeping pills or medicines that cause drowsiness. Do not make important decisions or sign legal documents. Do not take care of children on your own. Eating and drinking Follow any instructions from your health care provider about eating or drinking restrictions. When you feel  hungry, start by eating small amounts of foods that are soft and easy to digest (bland), such as toast. Gradually return to your regular diet. Drink enough fluid to keep your urine pale yellow. If you vomit, rehydrate by drinking water, juice, or clear broth. General instructions If you have sleep apnea, surgery and certain medicines can increase your risk for breathing problems. Follow instructions from your health care provider about wearing your sleep device: Anytime you are sleeping, including during daytime naps. While taking prescription pain medicines, sleeping medicines, or medicines that make you drowsy. Have a responsible adult stay with you for the time you are told. It is important to have someone help care for you until you are awake and alert. Return to your normal activities as told by your health care provider. Ask your health care provider what activities are safe for you. Take over-the-counter and prescription medicines only as told by your health care provider. If you smoke, do not smoke without supervision. Keep all follow-up visits as told by your health care provider. This is important. Contact a health care provider if: You have nausea or vomiting that does not get better with medicine. You cannot eat or drink without vomiting. You have pain that does not get better with medicine. You are unable to pass urine. You develop a skin rash. You have a fever. You have redness around your IV site that gets worse. Get help right away if: You have difficulty breathing. You have chest pain. You have blood in your urine or stool, or you vomit blood. Summary After the procedure, it is common to have a sore throat or nausea. It is also common to feel tired. Have a responsible adult stay with you for the time you are told. It is important to have someone help care for you until you are awake and alert. When you feel hungry, start by eating small amounts of foods that are soft and  easy to digest (bland), such as toast. Gradually return to your regular diet. Drink enough fluid to  keep your urine pale yellow. Return to your normal activities as told by your health care provider. Ask your health care provider what activities are safe for you. This information is not intended to replace advice given to you by your health care provider. Make sure you discuss any questions you have with your health care provider. Document Revised: 06/26/2020 Document Reviewed: 01/24/2020 Elsevier Patient Education  2022 [place]. How to Use Chlorhexidine for Bathing Chlorhexidine gluconate (CHG) is a germ-killing (antiseptic) solution that is used to clean the skin. It can get rid of the bacteria that normally live on the skin and can keep them away for about 24 hours. To clean your skin with CHG, you may be given: A CHG solution to use in the shower or as part of a sponge bath. A prepackaged cloth that contains CHG. Cleaning your skin with CHG may help lower the risk for infection: While you are staying in the intensive care unit of the hospital. If you have a vascular access, such as a central line, to provide short-term or long-term access to your veins. If you have a catheter to drain urine from your bladder. If you are on a ventilator. A ventilator is a machine that helps you breathe by moving air in and out of your lungs. After surgery. What are the risks? Risks of using CHG include: A skin reaction. Hearing loss, if CHG gets in your ears and you have a perforated eardrum. Eye injury, if CHG gets in your eyes and is not rinsed out. The CHG product catching fire. Make sure that you avoid smoking and flames after applying CHG to your skin. Do not use CHG: If you have a chlorhexidine allergy or have previously reacted to chlorhexidine. On babies younger than 3 months of age. How to use CHG solution Use CHG only as told by your health care provider, and follow the instructions on  the label. Use the full amount of CHG as directed. Usually, this is one bottle. During a shower Follow these steps when using CHG solution during a shower (unless your health care provider gives you different instructions): Start the shower. Use your normal soap and shampoo to wash your face and hair. Turn off the shower or move out of the shower stream. Pour the CHG onto a clean washcloth. Do not use any type of brush or rough-edged sponge. Starting at your neck, lather your body down to your toes. Make sure you follow these instructions: If you will be having surgery, pay special attention to the part of your body where you will be having surgery. Scrub this area for at least 1 minute. Do not use CHG on your head or face. If the solution gets into your ears or eyes, rinse them well with water. Avoid your genital area. Avoid any areas of skin that have broken skin, cuts, or scrapes. Scrub your back and under your arms. Make sure to wash skin folds. Let the lather sit on your skin for 1-2 minutes or as long as told by your health care provider. Thoroughly rinse your entire body in the shower. Make sure that all body creases and crevices are rinsed well. Dry off with a clean towel. Do not put any substances on your body afterward--such as powder, lotion, or perfume--unless you are told to do so by your health care provider. Only use lotions that are recommended by the manufacturer. Put on clean clothes or pajamas. If it is the night before your surgery, sleep  in clean sheets.  During a sponge bath Follow these steps when using CHG solution during a sponge bath (unless your health care provider gives you different instructions): Use your normal soap and shampoo to wash your face and hair. Pour the CHG onto a clean washcloth. Starting at your neck, lather your body down to your toes. Make sure you follow these instructions: If you will be having surgery, pay special attention to the part of  your body where you will be having surgery. Scrub this area for at least 1 minute. Do not use CHG on your head or face. If the solution gets into your ears or eyes, rinse them well with water. Avoid your genital area. Avoid any areas of skin that have broken skin, cuts, or scrapes. Scrub your back and under your arms. Make sure to wash skin folds. Let the lather sit on your skin for 1-2 minutes or as long as told by your health care provider. Using a different clean, wet washcloth, thoroughly rinse your entire body. Make sure that all body creases and crevices are rinsed well. Dry off with a clean towel. Do not put any substances on your body afterward--such as powder, lotion, or perfume--unless you are told to do so by your health care provider. Only use lotions that are recommended by the manufacturer. Put on clean clothes or pajamas. If it is the night before your surgery, sleep in clean sheets. How to use CHG prepackaged cloths Only use CHG cloths as told by your health care provider, and follow the instructions on the label. Use the CHG cloth on clean, dry skin. Do not use the CHG cloth on your head or face unless your health care provider tells you to. When washing with the CHG cloth: Avoid your genital area. Avoid any areas of skin that have broken skin, cuts, or scrapes. Before surgery Follow these steps when using a CHG cloth to clean before surgery (unless your health care provider gives you different instructions): Using the CHG cloth, vigorously scrub the part of your body where you will be having surgery. Scrub using a back-and-forth motion for 3 minutes. The area on your body should be completely wet with CHG when you are done scrubbing. Do not rinse. Discard the cloth and let the area air-dry. Do not put any substances on the area afterward, such as powder, lotion, or perfume. Put on clean clothes or pajamas. If it is the night before your surgery, sleep in clean sheets.  For  general bathing Follow these steps when using CHG cloths for general bathing (unless your health care provider gives you different instructions). Use a separate CHG cloth for each area of your body. Make sure you wash between any folds of skin and between your fingers and toes. Wash your body in the following order, switching to a new cloth after each step: The front of your neck, shoulders, and chest. Both of your arms, under your arms, and your hands. Your stomach and groin area, avoiding the genitals. Your right leg and foot. Your left leg and foot. The back of your neck, your back, and your buttocks. Do not rinse. Discard the cloth and let the area air-dry. Do not put any substances on your body afterward--such as powder, lotion, or perfume--unless you are told to do so by your health care provider. Only use lotions that are recommended by the manufacturer. Put on clean clothes or pajamas. Contact a health care provider if: Your skin gets irritated after  scrubbing. You have questions about using your solution or cloth. You swallow any chlorhexidine. Call your local poison control center (1-628-612-5871 in the [place] Get help right away if: Your eyes itch badly, or they become very red or swollen. Your skin itches badly and is red or swollen. Your hearing changes. You have trouble seeing. You have swelling or tingling in your mouth or throat. You have trouble breathing. These symptoms may represent a serious problem that is an emergency. Do not wait to see if the symptoms will go away. Get medical help right away. Call your local emergency services (911 in the [place] Do not drive yourself to the hospital. Summary Chlorhexidine gluconate (CHG) is a germ-killing (antiseptic) solution that is used to clean the skin. Cleaning your skin with CHG may help to lower your risk for infection. You may be given CHG to use for bathing. It may be in a bottle or in a prepackaged cloth to use on your  skin. Carefully follow your health care provider's instructions and the instructions on the product label. Do not use CHG if you have a chlorhexidine allergy. Contact your health care provider if your skin gets irritated after scrubbing. This information is not intended to replace advice given to you by your health care provider. Make sure you discuss any questions you have with your health care provider. Document Revised: 12/22/2020 Document Reviewed: 12/22/2020 Elsevier Patient Education  2022 [employer].

## 2021-12-15 NOTE — Pre-Procedure Instructions (Signed)
Messaged Dr Okey Dupre for orders.

## 2021-12-16 ENCOUNTER — Encounter (HOSPITAL_COMMUNITY): Payer: Self-pay

## 2021-12-16 ENCOUNTER — Encounter (HOSPITAL_COMMUNITY)
Admission: RE | Admit: 2021-12-16 | Discharge: 2021-12-16 | Disposition: A | Discharge status: Home / Self Care | Payer: Managed Care, Other (non HMO) | Source: Ambulatory Visit | Attending: Surgery | Admitting: Surgery

## 2021-12-16 ENCOUNTER — Other Ambulatory Visit: Payer: Self-pay

## 2021-12-18 ENCOUNTER — Encounter (HOSPITAL_COMMUNITY)
Admission: RE | Disposition: A | Discharge status: Home / Self Care | Payer: Self-pay | Source: Home / Self Care | Attending: Surgery

## 2021-12-18 ENCOUNTER — Other Ambulatory Visit: Payer: Self-pay

## 2021-12-18 ENCOUNTER — Ambulatory Visit (HOSPITAL_COMMUNITY): Payer: Managed Care, Other (non HMO) | Admitting: Anesthesiology

## 2021-12-18 ENCOUNTER — Ambulatory Visit (HOSPITAL_COMMUNITY)
Admission: RE | Admit: 2021-12-18 | Discharge: 2021-12-18 | Disposition: A | Discharge status: Home / Self Care | Payer: Managed Care, Other (non HMO) | Attending: Surgery | Admitting: Surgery

## 2021-12-18 ENCOUNTER — Encounter (HOSPITAL_COMMUNITY): Payer: Self-pay | Admitting: Surgery

## 2021-12-18 ENCOUNTER — Ambulatory Visit (HOSPITAL_BASED_OUTPATIENT_CLINIC_OR_DEPARTMENT_OTHER): Payer: Managed Care, Other (non HMO) | Admitting: Anesthesiology

## 2021-12-18 DIAGNOSIS — N433 Hydrocele, unspecified: Secondary | ICD-10-CM

## 2021-12-18 DIAGNOSIS — D176 Benign lipomatous neoplasm of spermatic cord: Secondary | ICD-10-CM | POA: Diagnosis not present

## 2021-12-18 DIAGNOSIS — K4090 Unilateral inguinal hernia, without obstruction or gangrene, not specified as recurrent: Secondary | ICD-10-CM

## 2021-12-18 DIAGNOSIS — Z87891 Personal history of nicotine dependence: Secondary | ICD-10-CM | POA: Insufficient documentation

## 2021-12-18 DIAGNOSIS — Z87442 Personal history of urinary calculi: Secondary | ICD-10-CM | POA: Insufficient documentation

## 2021-12-18 HISTORY — PX: HYDROCELE EXCISION: SHX482

## 2021-12-18 HISTORY — PX: INGUINAL HERNIA REPAIR: SHX194

## 2021-12-18 SURGERY — REPAIR, HERNIA, INGUINAL, ADULT
Anesthesia: General | Site: Scrotum | Laterality: Left

## 2021-12-18 MED ORDER — SUGAMMADEX SODIUM 200 MG/2ML IV SOLN
INTRAVENOUS | Status: DC | PRN
  Administered 2021-12-18: 150 mg via INTRAVENOUS

## 2021-12-18 MED ORDER — ORAL CARE MOUTH RINSE: 15.0000 mL | Freq: Once | OROMUCOSAL | Status: AC

## 2021-12-18 MED ORDER — OXYCODONE HCL 5 MG PO TABS
5.0000 mg | ORAL_TABLET | Freq: Once | ORAL | Status: AC
  Administered 2021-12-18: 5 mg via ORAL

## 2021-12-18 MED ORDER — PROPOFOL 10 MG/ML IV BOLUS
INTRAVENOUS | Status: DC | PRN
  Administered 2021-12-18: 200 mg via INTRAVENOUS

## 2021-12-18 MED ORDER — ACETAMINOPHEN 500 MG PO TABS: 1000.0000 mg | ORAL_TABLET | Freq: Four times a day (QID) | ORAL | 0 refills | Status: AC

## 2021-12-18 MED ORDER — MIDAZOLAM HCL 5 MG/5ML IJ SOLN
INTRAMUSCULAR | Status: DC | PRN
  Administered 2021-12-18: 2 mg via INTRAVENOUS

## 2021-12-18 MED ORDER — 0.9 % SODIUM CHLORIDE (POUR BTL) OPTIME
TOPICAL | Status: DC | PRN
  Administered 2021-12-18: 1000 mL

## 2021-12-18 MED ORDER — EPHEDRINE 5 MG/ML INJ
INTRAVENOUS | Status: AC
  Filled 2021-12-18: qty 5

## 2021-12-18 MED ORDER — CEPHALEXIN 500 MG PO CAPS: 500.0000 mg | ORAL_CAPSULE | Freq: Three times a day (TID) | ORAL | 0 refills | Status: AC

## 2021-12-18 MED ORDER — BUPIVACAINE HCL (PF) 0.25 % IJ SOLN
INTRAMUSCULAR | Status: AC
  Filled 2021-12-18: qty 30

## 2021-12-18 MED ORDER — OXYCODONE HCL 5 MG PO TABS: 5.0000 mg | ORAL_TABLET | Freq: Four times a day (QID) | ORAL | 0 refills | Status: DC | PRN

## 2021-12-18 MED ORDER — ONDANSETRON HCL 4 MG/2ML IJ SOLN
INTRAMUSCULAR | Status: AC
  Filled 2021-12-18: qty 2

## 2021-12-18 MED ORDER — FENTANYL CITRATE (PF) 250 MCG/5ML IJ SOLN
INTRAMUSCULAR | Status: AC
  Filled 2021-12-18: qty 5

## 2021-12-18 MED ORDER — ROCURONIUM 10MG/ML (10ML) SYRINGE FOR MEDFUSION PUMP - OPTIME
INTRAVENOUS | Status: DC | PRN
  Administered 2021-12-18: 10 mg via INTRAVENOUS
  Administered 2021-12-18: 50 mg via INTRAVENOUS

## 2021-12-18 MED ORDER — EPHEDRINE SULFATE (PRESSORS) 50 MG/ML IJ SOLN
INTRAMUSCULAR | Status: DC | PRN
  Administered 2021-12-18: 10 mg via INTRAVENOUS

## 2021-12-18 MED ORDER — CHLORHEXIDINE GLUCONATE CLOTH 2 % EX PADS: 6.0000 | MEDICATED_PAD | Freq: Once | CUTANEOUS | Status: DC

## 2021-12-18 MED ORDER — MIDAZOLAM HCL 2 MG/2ML IJ SOLN
INTRAMUSCULAR | Status: AC
  Filled 2021-12-18: qty 2

## 2021-12-18 MED ORDER — BUPIVACAINE HCL 0.25 % IJ SOLN
INTRAMUSCULAR | Status: DC | PRN
  Administered 2021-12-18: 10 mL

## 2021-12-18 MED ORDER — CEFAZOLIN SODIUM-DEXTROSE 2-4 GM/100ML-% IV SOLN
2.0000 g | INTRAVENOUS | Status: AC
  Administered 2021-12-18: 2 g via INTRAVENOUS

## 2021-12-18 MED ORDER — CEFAZOLIN SODIUM-DEXTROSE 2-4 GM/100ML-% IV SOLN: 2.0000 g | INTRAVENOUS | Status: DC

## 2021-12-18 MED ORDER — FENTANYL CITRATE (PF) 100 MCG/2ML IJ SOLN
INTRAMUSCULAR | Status: AC
  Filled 2021-12-18: qty 2

## 2021-12-18 MED ORDER — LIDOCAINE HCL (CARDIAC) PF 50 MG/5ML IV SOSY
PREFILLED_SYRINGE | INTRAVENOUS | Status: DC | PRN
Start: 2021-12-18 — End: 2021-12-18
  Administered 2021-12-18: 100 mg via INTRAVENOUS

## 2021-12-18 MED ORDER — OXYCODONE HCL 5 MG PO TABS
ORAL_TABLET | ORAL | Status: AC
  Filled 2021-12-18: qty 1

## 2021-12-18 MED ORDER — LACTATED RINGERS IV SOLN: INTRAVENOUS | Status: DC

## 2021-12-18 MED ORDER — PROPOFOL 10 MG/ML IV BOLUS
INTRAVENOUS | Status: AC
  Filled 2021-12-18: qty 20

## 2021-12-18 MED ORDER — DOCUSATE SODIUM 100 MG PO CAPS: 100.0000 mg | ORAL_CAPSULE | Freq: Two times a day (BID) | ORAL | 0 refills | Status: AC

## 2021-12-18 MED ORDER — ROCURONIUM BROMIDE 10 MG/ML (PF) SYRINGE
PREFILLED_SYRINGE | INTRAVENOUS | Status: AC
  Filled 2021-12-18: qty 10

## 2021-12-18 MED ORDER — HEPARIN SODIUM (PORCINE) 5000 UNIT/ML IJ SOLN
5000.0000 [IU] | Freq: Once | INTRAMUSCULAR | Status: AC
  Administered 2021-12-18: 5000 [IU] via SUBCUTANEOUS
  Filled 2021-12-18: qty 1

## 2021-12-18 MED ORDER — ONDANSETRON HCL 4 MG/2ML IJ SOLN: 4.0000 mg | Freq: Once | INTRAMUSCULAR | Status: DC | PRN

## 2021-12-18 MED ORDER — BUPIVACAINE HCL (300 MG DOSE) 3 X 100 MG IL IMPL
DRUG_IMPLANT | Status: AC
  Filled 2021-12-18: qty 100

## 2021-12-18 MED ORDER — ONDANSETRON HCL 4 MG/2ML IJ SOLN
INTRAMUSCULAR | Status: DC | PRN
  Administered 2021-12-18: 4 mg via INTRAVENOUS

## 2021-12-18 MED ORDER — KETOROLAC TROMETHAMINE 30 MG/ML IJ SOLN
INTRAMUSCULAR | Status: AC
  Filled 2021-12-18: qty 1

## 2021-12-18 MED ORDER — BUPIVACAINE HCL (300 MG DOSE) 3 X 100 MG IL IMPL
DRUG_IMPLANT | Status: DC | PRN
  Administered 2021-12-18: 200 mg

## 2021-12-18 MED ORDER — CEFAZOLIN SODIUM-DEXTROSE 2-4 GM/100ML-% IV SOLN
INTRAVENOUS | Status: AC
  Filled 2021-12-18: qty 100

## 2021-12-18 MED ORDER — KETOROLAC TROMETHAMINE 30 MG/ML IJ SOLN
INTRAMUSCULAR | Status: DC | PRN
  Administered 2021-12-18: 30 mg via INTRAVENOUS

## 2021-12-18 MED ORDER — MEPERIDINE HCL 50 MG/ML IJ SOLN: 6.2500 mg | INTRAMUSCULAR | Status: DC | PRN

## 2021-12-18 MED ORDER — CHLORHEXIDINE GLUCONATE 0.12 % MT SOLN
15.0000 mL | Freq: Once | OROMUCOSAL | Status: AC
  Administered 2021-12-18: 15 mL via OROMUCOSAL

## 2021-12-18 MED ORDER — BUPIVACAINE LIPOSOME 1.3 % IJ SUSP
INTRAMUSCULAR | Status: AC
  Filled 2021-12-18: qty 20

## 2021-12-18 MED ORDER — HYDROMORPHONE HCL 1 MG/ML IJ SOLN
0.2500 mg | INTRAMUSCULAR | Status: AC | PRN
  Administered 2021-12-18 (×4): 0.5 mg via INTRAVENOUS
  Filled 2021-12-18 (×4): qty 0.5

## 2021-12-18 MED ORDER — FENTANYL CITRATE (PF) 100 MCG/2ML IJ SOLN
INTRAMUSCULAR | Status: DC | PRN
  Administered 2021-12-18: 50 ug via INTRAVENOUS
  Administered 2021-12-18: 100 ug via INTRAVENOUS
  Administered 2021-12-18 (×4): 50 ug via INTRAVENOUS

## 2021-12-18 SURGICAL SUPPLY — 52 items
ADH SKN CLS APL DERMABOND .7 (GAUZE/BANDAGES/DRESSINGS) ×4
BLADE 15 SAFETY STRL DISP (BLADE) ×1 IMPLANT
BLADE SURG 15 STRL LF DISP TIS (BLADE) ×2 IMPLANT
BLADE SURG 15 STRL SS (BLADE) ×6
CLOTH BEACON ORANGE TIMEOUT ST (SAFETY) ×3 IMPLANT
COVER LIGHT HANDLE STERIS (MISCELLANEOUS) ×10 IMPLANT
DERMABOND ADVANCED (GAUZE/BANDAGES/DRESSINGS) ×2
DERMABOND ADVANCED .7 DNX12 (GAUZE/BANDAGES/DRESSINGS) ×4 IMPLANT
DRAIN PENROSE 0.5X18 (DRAIN) ×3 IMPLANT
DRAIN PENROSE 12X.25 LTX STRL (MISCELLANEOUS) ×1 IMPLANT
ELECT REM PT RETURN 9FT ADLT (ELECTROSURGICAL) ×3
ELECTRODE REM PT RTRN 9FT ADLT (ELECTROSURGICAL) ×4 IMPLANT
GAUZE 4X4 16PLY ~~LOC~~+RFID DBL (SPONGE) ×4 IMPLANT
GAUZE SPONGE 4X4 12PLY STRL LF (GAUZE/BANDAGES/DRESSINGS) ×2 IMPLANT
GLOVE SRG 8 PF TXTR STRL LF DI (GLOVE) ×2 IMPLANT
GLOVE SURG ENC MOIS LTX SZ6.5 (GLOVE) ×3 IMPLANT
GLOVE SURG LTX SZ7 (GLOVE) ×1 IMPLANT
GLOVE SURG POLYISO LF SZ8 (GLOVE) ×3 IMPLANT
GLOVE SURG UNDER POLY LF SZ7 (GLOVE) ×18 IMPLANT
GLOVE SURG UNDER POLY LF SZ8 (GLOVE) ×3
GOWN STRL REUS W/TWL LRG LVL3 (GOWN DISPOSABLE) ×12 IMPLANT
GOWN STRL REUS W/TWL XL LVL3 (GOWN DISPOSABLE) ×3 IMPLANT
INST SET MINOR GENERAL (KITS) ×3 IMPLANT
KIT TURNOVER KIT A (KITS) ×5 IMPLANT
MANIFOLD NEPTUNE II (INSTRUMENTS) ×5 IMPLANT
MESH SYNTHETIC 4X6 SOFT BARD (Mesh General) IMPLANT
MESH SYNTHETIC SOFT BARD 4X6 (Mesh General) ×1 IMPLANT
NDL HYPO 18GX1.5 BLUNT FILL (NEEDLE) ×2 IMPLANT
NDL HYPO 21X1.5 SAFETY (NEEDLE) ×2 IMPLANT
NDL HYPO 25X1 1.5 SAFETY (NEEDLE) ×2 IMPLANT
NEEDLE HYPO 18GX1.5 BLUNT FILL (NEEDLE) ×3 IMPLANT
NEEDLE HYPO 21X1.5 SAFETY (NEEDLE) ×6 IMPLANT
NEEDLE HYPO 25X1 1.5 SAFETY (NEEDLE) ×3 IMPLANT
NS IRRIG 1000ML POUR BTL (IV SOLUTION) ×5 IMPLANT
PACK MINOR (CUSTOM PROCEDURE TRAY) ×5 IMPLANT
PAD ARMBOARD 7.5X6 YLW CONV (MISCELLANEOUS) ×6 IMPLANT
SET BASIN LINEN APH (SET/KITS/TRAYS/PACK) ×5 IMPLANT
SOL PREP PROV IODINE SCRUB 4OZ (MISCELLANEOUS) ×3 IMPLANT
SPIKE FLUID TRANSFER (MISCELLANEOUS) ×1 IMPLANT
SPONGE GAUZE 4X4 12PLY STER LF (GAUZE/BANDAGES/DRESSINGS) ×1 IMPLANT
SPONGE INTESTINAL PEANUT (DISPOSABLE) ×1 IMPLANT
SUPPORT SCROTAL LG STRP (MISCELLANEOUS) ×3 IMPLANT
SUT CHROMIC 3 0 SH 27 (SUTURE) ×4 IMPLANT
SUT MNCRL AB 4-0 PS2 18 (SUTURE) ×3 IMPLANT
SUT NOVA NAB GS-22 2 2-0 T-19 (SUTURE) ×3 IMPLANT
SUT VIC AB 2-0 CT1 27 (SUTURE) ×3
SUT VIC AB 2-0 CT1 TAPERPNT 27 (SUTURE) ×2 IMPLANT
SUT VIC AB 3-0 SH 27 (SUTURE) ×6
SUT VIC AB 3-0 SH 27X BRD (SUTURE) ×4 IMPLANT
SYR 20ML LL LF (SYRINGE) ×3 IMPLANT
SYR 30ML LL (SYRINGE) ×3 IMPLANT
SYR CONTROL 10ML LL (SYRINGE) ×3 IMPLANT
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
## 2021-12-18 NOTE — Op Note (Signed)
OPERATIVE NOTE   Patient Name: Christopher Colon  MRN:  [id number]  Date of Procedure: 12/18/21  Preoperative diagnosis:  Left hydrocele  Postoperative diagnosis:  Left hydrocele  Procedure:  Left hydrocelectomy  Attending: Primus Bravo, MD  Anesthesia: General with local  Estimated blood loss: 5 mL  Fluids: Per anesthesia record  Drains: Penrose drain in left scrotum  Specimens: Left hydrocele sac  Antibiotics: Ancef 2 g IV  Findings: Left hydrocele; normal testicle and epididymis  Indications:  51 year old male with left hydrocele which developed after left epididymitis in October 2022.  His hydrocele has persisted.  He has had increased pain in the left inguinal area and left scrotum.  Scrotal ultrasound from 11/06/2021 demonstrated normal testes bilaterally and a large left hydrocele.  Due to his significant pain, he was referred to general surgery for evaluation of possible inguinal hernia.  CT imaging demonstrated a small left inguinal hernia containing fat and a large left hydrocele.  Options for management were discussed with the patient.  He presents now for a left hydrocelectomy.  A left inguinal hernia was performed by Dr. Okey Dupre.  Risk and benefits of the procedure were discussed.  He understands wishes to proceed as described.  Description of Procedure:  The patient received IV Ancef preoperatively.  After successful induction of a general anesthetic, the patient was placed on the operative table in the supine position.  A left inguinal hernia repair was performed by Dr. Okey Dupre.  Her procedure is dictated separately.  The lower abdomen and genital area were prepped and draped.  Examination revealed a left hydrocele.  A midline incision was made along the median raphae of the scrotum.  Incision was carried down through the dartos layer until the hydrocele sac was identified.  This was carefully dissected away from surrounding tissue.  The hydrocele was  delivered into the wound.  The hydrocele was opened with return of 80 mL of clear fluid.  Examination of the testicle revealed a normal testicle, epididymis, and cord.  A small amount of the hydrocele sac was excised.  The edges of the hydrocele sac were then everted behind the testicle and cord and sutured together using a running 3-0 Vicryl suture.  Care was taken to avoid any constriction of the cord.  Hemostasis was obtained.  Testicle was placed back into the left hemiscrotum.  1/4 inch Penrose drain was placed in the dependent portion of the left hemiscrotum through a separate stab wound.  This was secured to the skin using a 3-0 chromic suture.  The dartos layer was closed with a running 3-0 Vicryl suture.  10 mL of 0.25% plain Marcaine was injected into the incision.  The skin edges were then approximated using a running 3-0 chromic suture.  Dermabond was applied.  Sterile fluffs and a scrotal support were placed.  The patient was then extubated and taken to the postanesthesia care unit in stable condition.  Complications: None  Condition: Stable, extubated, transferred to PACU  Plan:  D/C home Follow-up 2/27 for drain removal

## 2021-12-18 NOTE — Discharge Instructions (Signed)
Ambulatory Surgery Discharge Instructions  General Anesthesia or Sedation Do not drive or operate heavy machinery for 24 hours.  Do not consume alcohol, tranquilizers, sleeping medications, or any non-prescribed medications for 24 hours. Do not make important decisions or sign any important papers in the next 24 hours. You should have someone with you tonight at home.  Activity  You are advised to go directly home from the hospital.  Restrict your activities and rest for a day.  Resume light activity tomorrow. No heavy lifting over 10 lbs or strenuous exercise. May apply ice as need to left groin  Fluids and Diet Begin with clear liquids, bouillon, dry toast, soda crackers.  If not nauseated, you may go to a regular diet when you desire.  Greasy and spicy foods are not advised.  Medications  If you have not had a bowel movement in 24 hours, take 2 tablespoons over the counter Milk of mag.             You May resume your blood thinners tomorrow (Aspirin, coumadin, or other).  You are being discharged with prescriptions for Opioid/Narcotic Medications: There are some specific considerations for these medications that you should know. Opioid Meds have risks & benefits. Addiction to these meds is always a concern with prolonged use Take medication only as directed Do not drive while taking narcotic pain medication Do not crush tablets or capsules Do not use a different container than medication was dispensed in Lock the container of medication in a cool, dry place out of reach of children and pets. Opioid medication can cause addiction Do not share with anyone else (this is a felony) Do not store medications for future use. Dispose of them properly.     Disposal:  Find a [employer] household drug take back site near you.  If you can't get to a drug take back site, use the recipe below as a last resort to dispose of expired, unused or unwanted drugs. Disposal  (Do not dispose  chemotherapy drugs this way, talk to your prescribing doctor instead.) Step 1: Mix drugs (do not crush) with dirt, kitty litter, or used coffee grounds and add a small amount of water to dissolve any solid medications. Step 2: Seal drugs in plastic bag. Step 3: Place plastic bag in trash. Step 4: Take prescription container and scratch out personal information, then recycle or throw away.  Operative Site  You have a liquid bandage over your left groin incision, this will begin to flake off in about a week. Ok to Games developer. Keep wound clean and dry. No baths or swimming. No lifting more than 10 pounds.  Contact Information: If you have questions or concerns, please call our office, [phone], Monday- Thursday 8AM-5PM and Friday 8AM-12Noon.  If it is after hours or on the weekend, please call [facility] Main Number, [phone], and ask to speak to the surgeon on call for Dr. Okey Dupre at [facility].   SPECIFIC COMPLICATIONS TO WATCH FOR: Inability to urinate Fever over 101? F by mouth Nausea and vomiting lasting longer than 24 hours. Pain not relieved by medication ordered Swelling around the operative site Increased redness, warmth, hardness, around operative area Numbness, tingling, or cold fingers or toes Blood -soaked dressing, (small amounts of oozing may be normal) Increasing and progressive drainage from surgical area or exam site

## 2021-12-18 NOTE — Progress Notes (Signed)
Update Note:  Called patient's wife, Lowery Paullin, to update her after my portion of the surgery.  It was explained that he had a left inguinal hernia that was repaired without difficulty.  A mesh was sutured into place, and it was explained that he will be discharged home with pain medications.  I also advised him to take scheduled Tylenol for a couple days.  All questions were answered to her expressed satisfaction.  Graciella Freer, DO [facility] [address] [place], [place] [address] [phone] (office)

## 2021-12-18 NOTE — Anesthesia Procedure Notes (Signed)
Procedure Name: Intubation Date/Time: 12/18/2021 9:05 AM Performed by: Ollen Bowl, CRNA Pre-anesthesia Checklist: Patient identified, Patient being monitored, Timeout performed, Emergency Drugs available and Suction available Patient Re-evaluated:Patient Re-evaluated prior to induction Oxygen Delivery Method: Circle system utilized Preoxygenation: Pre-oxygenation with 100% oxygen Induction Type: IV induction Ventilation: Mask ventilation without difficulty Laryngoscope Size: Mac and 3 Grade View: Grade I Tube type: Oral Tube size: 7.0 mm Number of attempts: 1 Airway Equipment and Method: Stylet Placement Confirmation: ETT inserted through vocal cords under direct vision, positive ETCO2 and breath sounds checked- equal and bilateral Secured at: 21 cm Tube secured with: Tape Dental Injury: Teeth and Oropharynx as per pre-operative assessment

## 2021-12-18 NOTE — Anesthesia Postprocedure Evaluation (Signed)
Anesthesia Post Note  Patient: Christopher Colon  Procedure(s) Performed: HERNIA REPAIR INGUINAL ADULT W/MESH (Left: Inguinal) HYDROCELECTOMY ADULT (Left: Scrotum)  Patient location during evaluation: Phase II Anesthesia Type: General Level of consciousness: awake and alert and oriented Pain management: pain level controlled Vital Signs Assessment: post-procedure vital signs reviewed and stable Respiratory status: spontaneous breathing, nonlabored ventilation and respiratory function stable Cardiovascular status: blood pressure returned to baseline and stable Postop Assessment: no apparent nausea or vomiting Anesthetic complications: no   No notable events documented.   Last Vitals:  Vitals:   12/18/21 1215 12/18/21 1223  BP: (!) 133/98 (!) 134/99  Pulse: 91 90  Resp: (!) 21 18  Temp:  36.7 C  SpO2: 94% 95%    Last Pain:  Vitals:   12/18/21 1223  TempSrc: Oral  PainSc: 9                  Froilan Mclean C Brent Taillon

## 2021-12-18 NOTE — Transfer of Care (Signed)
Immediate Anesthesia Transfer of Care Note  Patient: Christopher Colon  Procedure(s) Performed: HERNIA REPAIR INGUINAL ADULT W/MESH (Left: Inguinal) HYDROCELECTOMY ADULT (Left: Scrotum)  Patient Location: PACU  Anesthesia Type:General  Level of Consciousness: awake  Airway & Oxygen Therapy: Patient Spontanous Breathing  Post-op Assessment: Report given to RN  Post vital signs: Reviewed and stable  Last Vitals:  Vitals Value Taken Time  BP 131/94 12/18/21 1108  Temp    Pulse 94 12/18/21 1111  Resp 17 12/18/21 1111  SpO2 93 % 12/18/21 1111  Vitals shown include unvalidated device data.  Last Pain:  Vitals:   12/18/21 0742  TempSrc: Oral  PainSc: 4       Patients Stated Pain Goal: 6 (96/43/83 8184)  Complications: No notable events documented.

## 2021-12-18 NOTE — Op Note (Signed)
[facility] Operative Note  12/18/21  Preoperative Diagnosis: Left inguinal hernia, left hydrocele   Postoperative Diagnosis: Same   Procedure(s) Performed: Left inguinal hernia repair with mesh, Left hydrocelectomy by Dr. Felipa Eth   Surgeon: Graciella Freer, DO    Assistants: Marquita Palms, RN   Anesthesia: General endotracheal   Anesthesiologist: Denese Killings, MD    Specimens: Spermatic cord lipoma   Estimated Blood Loss: Minimal   Blood Replacement: None    Complications: None   Wound Class: Clean   Operative Indications: Patient is a 51 year old male who presents for joint case of open left inguinal hernia repair with mesh and left hydrocelectomy with Dr. Felipa Eth.  He has been having significant left groin and scrotal pain for the last couple months, and CT pelvis demonstrated a small left inguinal hernia and a large left hydrocele.  All risks, benefits, and alternatives to open left inguinal hernia repair with mesh were discussed with the patient, all of his questions were answered to his expressed satisfaction. The patient expresses he wishes to proceed, and informed consent was obtained.  Findings: Moderately sized direct left inguinal hernia, no left indirect inguinal hernia, small left spermatic cord lipoma   Procedure: The patient was taken to the operating room and placed supine. General endotracheal anesthesia was induced. Intravenous antibiotics were administered per protocol.  A time out was preformed verifying the correct patient, procedure, site, positioning and implants.  The left groin and scrotum were prepared and draped in the usual sterile fashion.   An incision was made in a natural skin crease between the pubic tubercle and the anterior superior iliac spine.  The incision was deepened with electrocautery through Scarpa's and Camper's fascia until the aponeurosis of the external oblique was encountered.  This was cleaned and  the external ring was exposed.  An incision was made in the midportion of the external oblique aponeurosis in the direction of its fibers. The ilioinguinal nerve was identified and was protected throughout the dissection.  Flaps of the external oblique were developed cephalad and inferiorly.    The cord was identified and it was gently dissented free at the pubic tubercle and encircled with a Penrose drain.  Attention was then directed at the anteromedial aspect of the cord, where no indirect hernia sac was identified.  A spermatic cord lipoma was identified and dissected from the cord structures, and sent to pathology for evaluation.  The vas and testicular vessels were identified and protected from harm.  Attention was then turned to the floor of the canal, which was grossly weakened with a defect noted.  The defect was closed with a purse string stitch using 2-0 Novafil.  The Bard Soft Mesh was cut to appropriate size and sutured to the inguinal ligament inferiorly starting at the pubic tubercle using 2-0 Novafil interrupted sutures.  The mesh was sutured superiorly to the conjoint tendon using 2-0 Novafil interrupted sutures.  Care was taken to ensure the mesh was placed in a relaxed fashion to avoid excessive tension and no neurovascular structures were caught in the repair.  Laterally the tails of the mesh were crossed and the internal ring was recreated, allowing for passage of cords without tension.   Hemostasis was adequate.  The Penrose was removed.  The external oblique aponeurosis was closed with a 2-0 Vicryl suture in a running fashion, taking care to not catch the ilioinguinal nerve in the suture line.  Scarpa's fashion was closed with 2-0 Vicryl interrupted sutures.  3-0 Vicryl suture was used to close the deep dermis. The skin was closed with a subcuticular 4-0 Monocryl suture.  Dermabond was applied. The testis was gently pulled down into its anatomic position in the scrotum.  At this time, Dr.  Felipa Eth began his case of left hydrocelectomy.  Please refer to his note for this portion of the case.    Graciella Freer, DO  [facility] [address] [place], [place] [address] [phone] (office)
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
## 2021-12-18 NOTE — Anesthesia Preprocedure Evaluation (Addendum)
Anesthesia Evaluation  Patient identified by MRN, date of birth, ID band Patient awake    Reviewed: Allergy & Precautions, NPO status , Patient's Chart, lab work & pertinent test results  Airway Mallampati: II  TM Distance: >3 FB Neck ROM: Full    Dental  (+) Dental Advisory Given, Missing   Pulmonary neg pulmonary ROS, former smoker,    Pulmonary exam normal breath sounds clear to auscultation       Cardiovascular negative cardio ROS Normal cardiovascular exam Rhythm:Regular Rate:Normal     Neuro/Psych  Headaches, PSYCHIATRIC DISORDERS Anxiety Depression    GI/Hepatic negative GI ROS, (+)     substance abuse (last use 2006)  cocaine use,   Endo/Other  negative endocrine ROS  Renal/GU Renal disease  negative genitourinary   Musculoskeletal negative musculoskeletal ROS (+)   Abdominal   Peds negative pediatric ROS (+)  Hematology negative hematology ROS (+)   Anesthesia Other Findings   Reproductive/Obstetrics negative OB ROS                            Anesthesia Physical Anesthesia Plan  ASA: 2  Anesthesia Plan: General   Post-op Pain Management: Dilaudid IV   Induction: Intravenous  PONV Risk Score and Plan: 3 and Ondansetron, Dexamethasone and Midazolam  Airway Management Planned: Oral ETT  Additional Equipment:   Intra-op Plan:   Post-operative Plan: Extubation in OR  Informed Consent: I have reviewed the patients History and Physical, chart, labs and discussed the procedure including the risks, benefits and alternatives for the proposed anesthesia with the patient or authorized representative who has indicated his/her understanding and acceptance.     Dental advisory given  Plan Discussed with: CRNA and Surgeon  Anesthesia Plan Comments:        Anesthesia Quick Evaluation

## 2021-12-21 ENCOUNTER — Other Ambulatory Visit: Payer: Self-pay

## 2021-12-21 ENCOUNTER — Telehealth: Payer: Self-pay | Admitting: Family Medicine

## 2021-12-21 ENCOUNTER — Ambulatory Visit (INDEPENDENT_AMBULATORY_CARE_PROVIDER_SITE_OTHER): Payer: Managed Care, Other (non HMO) | Admitting: Urology

## 2021-12-21 VITALS — BP 137/81 | HR 91 | Wt 200.0 lb

## 2021-12-21 DIAGNOSIS — R3129 Other microscopic hematuria: Secondary | ICD-10-CM

## 2021-12-21 LAB — URINALYSIS, ROUTINE W REFLEX MICROSCOPIC
Bilirubin, UA: NEGATIVE
Glucose, UA: NEGATIVE
Leukocytes,UA: NEGATIVE
Nitrite, UA: NEGATIVE
Protein,UA: NEGATIVE
RBC, UA: NEGATIVE
Specific Gravity, UA: 1.02 (ref 1.005–1.030)
Urobilinogen, Ur: 1 mg/dL (ref 0.2–1.0)
pH, UA: 7 (ref 5.0–7.5)

## 2021-12-21 LAB — SURGICAL PATHOLOGY

## 2021-12-21 MED ORDER — OXYCODONE HCL 5 MG PO TABS: 5.0000 mg | ORAL_TABLET | Freq: Four times a day (QID) | ORAL | 0 refills | Status: AC | PRN

## 2021-12-21 NOTE — Telephone Encounter (Signed)
See note in Epic for FMLA & STD paperwork

## 2021-12-21 NOTE — Telephone Encounter (Signed)
STD paperwork completed and faxed to [employer] at [phone]. Received confirmation  FMLA paperwork completed and faxed to [employer], along with STD paperwork, at [phone]. Received confirmation.  Out of work from 12/18/2021 (surgery) with return to work date of 02/01/2022 without restrictions.

## 2021-12-22 ENCOUNTER — Encounter (HOSPITAL_COMMUNITY): Payer: Self-pay | Admitting: Surgery
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
## 2021-12-29 ENCOUNTER — Other Ambulatory Visit: Payer: Self-pay

## 2021-12-29 ENCOUNTER — Ambulatory Visit (INDEPENDENT_AMBULATORY_CARE_PROVIDER_SITE_OTHER): Payer: Managed Care, Other (non HMO) | Admitting: Urology

## 2021-12-29 ENCOUNTER — Encounter: Payer: Self-pay | Admitting: Urology

## 2021-12-29 VITALS — BP 133/93 | HR 90

## 2021-12-29 DIAGNOSIS — N433 Hydrocele, unspecified: Secondary | ICD-10-CM
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
## 2022-01-05 ENCOUNTER — Ambulatory Visit (INDEPENDENT_AMBULATORY_CARE_PROVIDER_SITE_OTHER): Payer: Managed Care, Other (non HMO) | Admitting: Surgery

## 2022-01-05 ENCOUNTER — Encounter: Payer: Self-pay | Admitting: Surgery

## 2022-01-05 ENCOUNTER — Other Ambulatory Visit: Payer: Self-pay

## 2022-01-05 VITALS — BP 130/86 | HR 82 | Temp 98.0°F | Resp 14 | Ht 69.0 in | Wt 199.0 lb

## 2022-01-05 DIAGNOSIS — Z09 Encounter for follow-up examination after completed treatment for conditions other than malignant neoplasm: Secondary | ICD-10-CM

## 2022-01-05 NOTE — Patient Instructions (Signed)
Follow up as needed.  Contact office to get release to return to work ?

## 2022-01-05 NOTE — Progress Notes (Signed)
[facility] Surgical Clinic Note  ? ?HPI:  ?51 y.o. Male presents to clinic for post-op follow-up status post open right inguinal hernia on 2/24. Patient reports that he has been doing significantly better since the surgery, and his pain has improved.  He still has some pain associated with his left inguinal incision, and into the left side of his scrotum, however it is significantly improved than the pain he was experiencing preoperatively.  He denies fevers and chills, and is tolerating a diet without nausea and vomiting.  He states that he is no longer requiring narcotic pain medications. ? ?Review of Systems:  ?All other review of systems: otherwise negative  ? ?Vital Signs:  ?BP 130/86   Pulse 82   Temp 98 ?F (36.7 ?C) (Other (Comment))   Resp 14   Ht '5\' 9"'$  (1.753 m)   Wt 199 lb (90.3 kg)   SpO2 96%   BMI 29.39 kg/m?   ? ?Physical Exam:  ?Physical Exam ?Vitals reviewed.  ?Constitutional:   ?   Appearance: Normal appearance.  ?Genitourinary: ?   Comments: Left inguinal incision healing well without erythema, drainage, or signs of infection; small amount of underlying hardness; scrotal incision healing well without drainage ?Neurological:  ?   Mental Status: He is alert.  ? ?Laboratory studies: None ? ?Imaging: None ? ?Assessment:  ?50 y.o. yo Male who presents status post left inguinal hernia repair and left hydrocelectomy on 2/24 ? ?Plan:  ?-Patient's pain significantly improved since surgery ?-Explained that the hardness underneath his left inguinal excision is normal postoperatively, and it will continue to improve over the next few months ?-Explained that the patient is okay to resume some sexual activity, though explained that he should avoid activities that require significant abdominal straining ?-Patient needs to avoid heavy lifting for another 2 to 4 weeks, for total of 4 to 6 weeks postoperatively  ?-Patient is scheduled to return to work on April 10, explained that he can send paperwork to  the office regarding release for return to work, and he only needs to follow-up with [place] if he is having issues ?-Follow-up as needed ? ?All of the above recommendations were discussed with the patient, and all of patient's questions were answered to his expressed satisfaction. ? ?Graciella Freer, DO ?[facility] ?[place], [place] [address] ?[phone] (office) ? ? ?

## 2022-01-19 ENCOUNTER — Other Ambulatory Visit: Payer: Self-pay

## 2022-01-19 ENCOUNTER — Ambulatory Visit (INDEPENDENT_AMBULATORY_CARE_PROVIDER_SITE_OTHER): Payer: Managed Care, Other (non HMO) | Admitting: Physician Assistant

## 2022-01-19 VITALS — BP 144/92 | HR 75 | Ht 69.0 in | Wt 200.0 lb

## 2022-01-19 DIAGNOSIS — N433 Hydrocele, unspecified: Secondary | ICD-10-CM

## 2022-01-19 DIAGNOSIS — R3129 Other microscopic hematuria: Secondary | ICD-10-CM

## 2022-01-20 DIAGNOSIS — R3129 Other microscopic hematuria: Secondary | ICD-10-CM | POA: Insufficient documentation

## 2022-01-20 LAB — URINALYSIS, ROUTINE W REFLEX MICROSCOPIC
Bilirubin, UA: NEGATIVE
Glucose, UA: NEGATIVE
Ketones, UA: NEGATIVE
Leukocytes,UA: NEGATIVE
Nitrite, UA: NEGATIVE
Protein,UA: NEGATIVE
RBC, UA: NEGATIVE
Specific Gravity, UA: 1.025 (ref 1.005–1.030)
Urobilinogen, Ur: 0.2 mg/dL (ref 0.2–1.0)
pH, UA: 6 (ref 5.0–7.5)
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
## 2022-01-27 ENCOUNTER — Encounter: Payer: Self-pay | Admitting: Family Medicine

## 2024-11-23 ENCOUNTER — Encounter (HOSPITAL_BASED_OUTPATIENT_CLINIC_OR_DEPARTMENT_OTHER): Payer: Self-pay | Admitting: Emergency Medicine

## 2024-11-23 ENCOUNTER — Other Ambulatory Visit: Payer: Self-pay

## 2024-11-23 ENCOUNTER — Emergency Department (HOSPITAL_BASED_OUTPATIENT_CLINIC_OR_DEPARTMENT_OTHER)
Admission: EM | Admit: 2024-11-23 | Discharge: 2024-11-23 | Disposition: A | Discharge status: Home / Self Care | Payer: Worker's Compensation | Attending: Emergency Medicine | Admitting: Emergency Medicine

## 2024-11-23 ENCOUNTER — Other Ambulatory Visit (HOSPITAL_BASED_OUTPATIENT_CLINIC_OR_DEPARTMENT_OTHER): Payer: Self-pay

## 2024-11-23 ENCOUNTER — Emergency Department (HOSPITAL_BASED_OUTPATIENT_CLINIC_OR_DEPARTMENT_OTHER)

## 2024-11-23 DIAGNOSIS — M62830 Muscle spasm of back: Secondary | ICD-10-CM | POA: Insufficient documentation

## 2024-11-23 DIAGNOSIS — S7002XA Contusion of left hip, initial encounter: Secondary | ICD-10-CM | POA: Diagnosis present

## 2024-11-23 DIAGNOSIS — W000XXA Fall on same level due to ice and snow, initial encounter: Secondary | ICD-10-CM | POA: Diagnosis not present

## 2024-11-23 MED ORDER — CYCLOBENZAPRINE HCL 10 MG PO TABS
10.0000 mg | ORAL_TABLET | Freq: Once | ORAL | Status: AC
  Administered 2024-11-23: 10 mg via ORAL
  Filled 2024-11-23: qty 1

## 2024-11-23 MED ORDER — CYCLOBENZAPRINE HCL 10 MG PO TABS
10.0000 mg | ORAL_TABLET | Freq: Two times a day (BID) | ORAL | 0 refills | Status: AC | PRN
  Filled 2024-11-23 (×2): qty 20, 10d supply, fill #0

## 2024-11-23 MED ORDER — KETOROLAC TROMETHAMINE 60 MG/2ML IM SOLN
30.0000 mg | Freq: Once | INTRAMUSCULAR | Status: AC
  Administered 2024-11-23: 30 mg via INTRAMUSCULAR
  Filled 2024-11-23: qty 2

## 2024-11-23 MED ORDER — LIDOCAINE 5 % EX PTCH
1.0000 | MEDICATED_PATCH | CUTANEOUS | 0 refills | Status: AC
  Filled 2024-11-23: qty 30, 30d supply, fill #0

## 2024-11-23 MED ORDER — OXYCODONE-ACETAMINOPHEN 5-325 MG PO TABS
1.0000 | ORAL_TABLET | ORAL | Status: DC | PRN
  Administered 2024-11-23: 1 via ORAL
  Filled 2024-11-23: qty 1

## 2024-11-23 NOTE — ED Provider Notes (Signed)
 " [facility] Colon [facility] AT [facility] Provider Note   CSN: [id number] Arrival date & time: 11/23/24  1205     Patient presents with: Christopher Colon is a 54 y.o. male.   Patient here after mechanical fall on ice.  Pain to his left hip.  Pain to his low back.  Denies hitting his head or losing consciousness.  Is not on any blood thinners.  Denies any weakness numbness tingling.  Pain shoots from his left hip into his knee.  Feels tightness in his low back.  No headache no neck pain.  No other extremity pain.  The history is provided by the patient.       Prior to Admission medications  Medication Sig Start Date End Date Taking? Authorizing Provider  cyclobenzaprine  (FLEXERIL ) 10 MG tablet Take 1 tablet (10 mg total) by mouth 2 (two) times daily as needed for muscle spasms. 11/23/24  Yes Christopher Bibbins, DO  lidocaine  (LIDODERM ) 5 % Place 1 patch onto the skin daily. Remove & Discard patch within 12 hours or as directed by MD 11/23/24  Yes Christopher Sidle, DO  oxyCODONE  (ROXICODONE ) 5 MG immediate release tablet Take 1 tablet (5 mg total) by mouth every 6 (six) hours as needed for severe pain. Patient not taking: Reported on 01/19/2022 12/21/21   Christopher Belvie CROME, MD  SUMAtriptan  (IMITREX ) 100 MG tablet Take 1 tablet (100 mg total) by mouth every 2 (two) hours as needed for migraine. May repeat in 2 hours if headache persists or recurs. Patient not taking: Reported on 01/19/2022 01/20/15   Christopher Baller, MD    Allergies: Patient has no known allergies.    Review of Systems  Updated Vital Signs BP 122/82   Pulse 73   Temp 97.8 F (36.6 C)   Resp 18   Ht 5' 9 (1.753 m)   Wt 93.4 kg   SpO2 94%   BMI 30.42 kg/m   Physical Exam Vitals and nursing note reviewed.  Constitutional:      General: He is not in acute distress.    Appearance: He is well-developed. He is not ill-appearing.  HENT:     Head: Normocephalic and atraumatic.     Mouth/Throat:      Mouth: Mucous membranes are moist.  Eyes:     Extraocular Movements: Extraocular movements intact.     Conjunctiva/sclera: Conjunctivae normal.     Pupils: Pupils are equal, round, and reactive to light.  Cardiovascular:     Rate and Rhythm: Normal rate and regular rhythm.     Heart sounds: No murmur heard. Pulmonary:     Effort: Pulmonary effort is normal. No respiratory distress.     Breath sounds: Normal breath sounds.  Abdominal:     Palpations: Abdomen is soft.     Tenderness: There is no abdominal tenderness.  Musculoskeletal:        General: Tenderness present. No swelling.     Cervical back: Normal range of motion and neck supple. No tenderness.     Comments: Tenderness to the left hip, paraspinal lumbar muscles on the left  Skin:    General: Skin is warm and dry.     Capillary Refill: Capillary refill takes less than 2 seconds.  Neurological:     General: No focal deficit present.     Mental Status: He is alert and oriented to person, place, and time.     Cranial Nerves: No cranial nerve deficit.  Sensory: No sensory deficit.     Motor: No weakness.     Coordination: Coordination normal.  Psychiatric:        Mood and Affect: Mood normal.     (all labs ordered are listed, but only abnormal results are displayed) Labs Reviewed - No data to display  EKG: None  Radiology: DG Lumbar Spine Complete Result Date: 11/23/2024 CLINICAL DATA:  Fall.  Pain. EXAM: LUMBAR SPINE - COMPLETE 4+ VIEW COMPARISON:  CT 12/01/2023 FINDINGS: Normal alignment in the lumbar spine. Vertebral body heights and disc spaces are maintained. Mild degenerative endplate changes. Negative for acute fracture. Negative for a pars defect. IMPRESSION: No acute abnormality in lumbar spine. Electronically Signed   By: Christopher Colon M.D.   On: 11/23/2024 14:54   DG Hip Unilat W or Wo Pelvis 2-3 Views Left Result Date: 11/23/2024 CLINICAL DATA:  Fall with injury.  Left hip pain. EXAM: DG HIP (WITH OR  WITHOUT PELVIS) 2-3V LEFT COMPARISON:  None Available. FINDINGS: Pelvic bony ring is intact. Left hip is located without acute fracture. Mild degenerative changes in both hips. Symmetric appearance of the SI joints without gross abnormality. IMPRESSION: No acute bone abnormality to the pelvis or left hip. Electronically Signed   By: Christopher Colon M.D.   On: 11/23/2024 14:52     Procedures   Medications Ordered in the ED  oxyCODONE -acetaminophen  (PERCOCET/ROXICET) 5-325 MG per tablet 1 tablet (1 tablet Oral Given 11/23/24 1219)  ketorolac  (TORADOL ) injection 30 mg (30 mg Intramuscular Given 11/23/24 1305)  cyclobenzaprine  (FLEXERIL ) tablet 10 mg (10 mg Oral Given 11/23/24 1305)                                    Medical Decision Making Amount and/or Complexity of Data Reviewed Radiology: ordered.  Risk Prescription drug management.   Christopher Colon is here left hip pain left lower back pain after fall on ice.  Landed directly on his buttocks/left hip.  Did not hit his head or lose consciousness.  No headache no neck pain.  No midline spinal pain.  Ultimately unremarkable vitals.  Will get x-rays of his low back hip and give IM Toradol  Percocet and Flexeril .  Neurovascular neuromuscular intact on exam.  No other traumatic findings elsewhere.  X-rays overall per my review interpretation no fracture or malalignment.  Overall I do suspect muscle spasm contusion.  Recommend Tylenol  ibuprofen ice and rest.  Will give crutches for weightbearing as tolerated.  Will prescribe lidocaine  patches and muscle relaxant.  Educated about the use of these meds.  Follow-up with primary care.  Discharged in good condition.  Have no concern for any other traumatic injuries.  This chart was dictated using voice recognition software.  Despite best efforts to proofread,  errors can occur which can change the documentation meaning.      Final diagnoses:  Contusion of left hip, initial encounter  Back spasm    ED  Discharge Orders          Ordered    cyclobenzaprine  (FLEXERIL ) 10 MG tablet  2 times daily PRN        11/23/24 1414    lidocaine  (LIDODERM ) 5 %  Every 24 hours        11/23/24 1415               Ruthe Juliene, DO 11/23/24 1502  "

## 2024-11-23 NOTE — ED Triage Notes (Signed)
 Pt reports mechanical fall onto ice today, c/o L hip pain radiating down leg.    Denies LOC, blood thinners, head injury.

## 2024-11-23 NOTE — ED Notes (Signed)
 Oral drug/alcohol screen completed and sent to [facility]

## 2024-11-23 NOTE — Discharge Instructions (Signed)
 Recommend 1000 mg of Tylenol  every 6 hours as needed for pain, recommend 400 mg ibuprofen every 8 hours as needed for pain.  Recommend buying lidocaine  patches over-the-counter or filling your prescription whichever is cheaper to help with further pain management.  I have written you for a muscle relaxant called Flexeril  as well.  Crutches weightbearing as tolerated.  Follow-up with primary care for further pain management.
